# Patient Record
Sex: Female | Born: 2000 | Race: White | Hispanic: No | Marital: Single | Smoking: Never smoker
Health system: Southern US, Community
[De-identification: ages and names within clinical notes are randomized; demographics above are authoritative.]

## PROBLEM LIST (undated history)

## (undated) DIAGNOSIS — F32A Depression, unspecified: Secondary | ICD-10-CM

## (undated) DIAGNOSIS — L309 Dermatitis, unspecified: Secondary | ICD-10-CM

## (undated) DIAGNOSIS — F419 Anxiety disorder, unspecified: Secondary | ICD-10-CM

## (undated) HISTORY — DX: Dermatitis, unspecified: L30.9

## (undated) HISTORY — DX: Anxiety disorder, unspecified: F41.9

## (undated) HISTORY — DX: Depression, unspecified: F32.A

---
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
## 2018-03-10 DIAGNOSIS — Z0000 Encounter for general adult medical examination without abnormal findings: Secondary | ICD-10-CM | POA: Diagnosis not present

## 2018-03-10 DIAGNOSIS — J45990 Exercise induced bronchospasm: Secondary | ICD-10-CM | POA: Diagnosis not present

## 2018-03-10 DIAGNOSIS — Z13220 Encounter for screening for lipoid disorders: Secondary | ICD-10-CM | POA: Diagnosis not present

## 2018-03-10 DIAGNOSIS — Z23 Encounter for immunization: Secondary | ICD-10-CM | POA: Diagnosis not present

## 2018-07-31 DIAGNOSIS — H9312 Tinnitus, left ear: Secondary | ICD-10-CM | POA: Diagnosis not present

## 2018-08-09 DIAGNOSIS — M25571 Pain in right ankle and joints of right foot: Secondary | ICD-10-CM | POA: Diagnosis not present

## 2018-09-12 DIAGNOSIS — R21 Rash and other nonspecific skin eruption: Secondary | ICD-10-CM | POA: Diagnosis not present

## 2019-02-03 IMAGING — US US SOFT TISSUE HEAD/NECK
1 series · 12 of 12 positions shown · non-contrast
Comparison: None.

CLINICAL DATA: Palpable nodularity posterior to right ear.

EXAM:
ULTRASOUND OF HEAD/NECK SOFT TISSUES
TECHNIQUE: Ultrasound examination of the head and neck soft tissues was
performed in the area of clinical concern.

[Series 1: us soft tissue head/neck · 0.06mm/px · 12 of 12 slices shown]
[im 1/12]
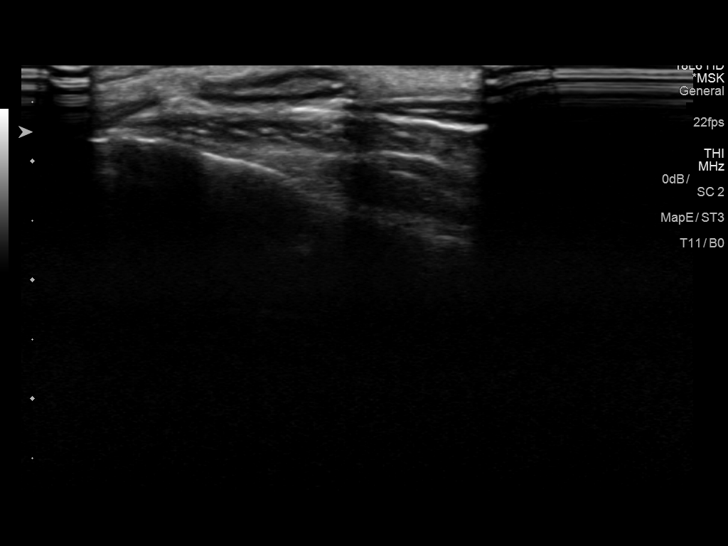
[im 2/12]
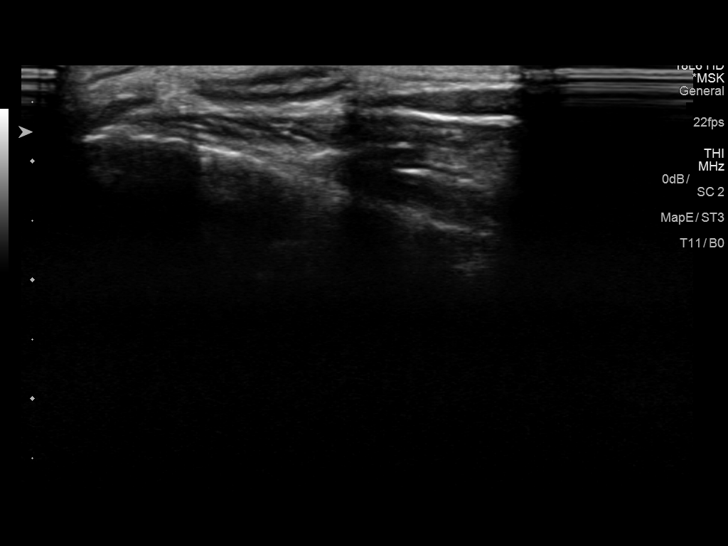
[im 3/12]
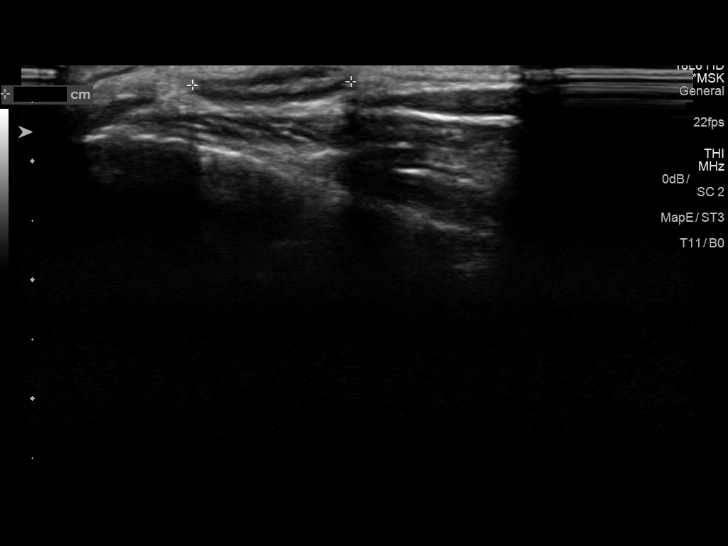
[im 4/12]
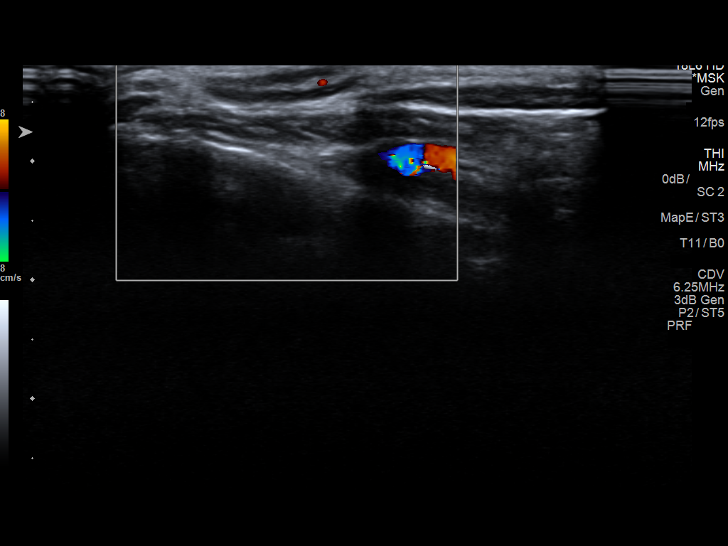
[im 5/12]
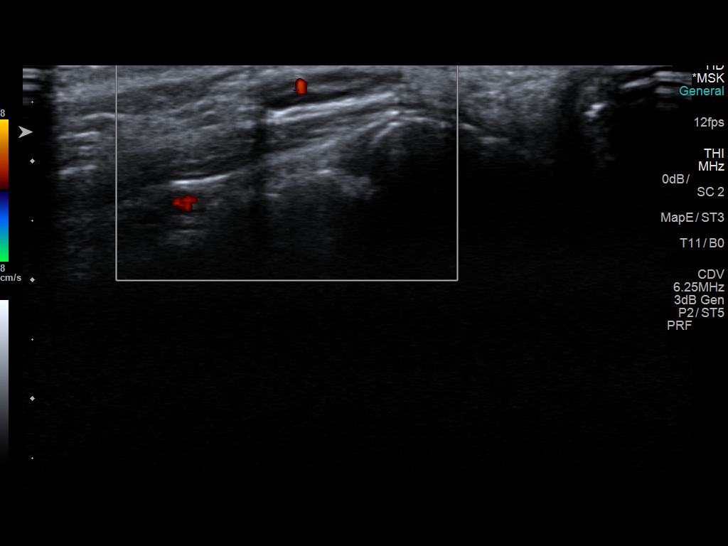
[im 6/12]
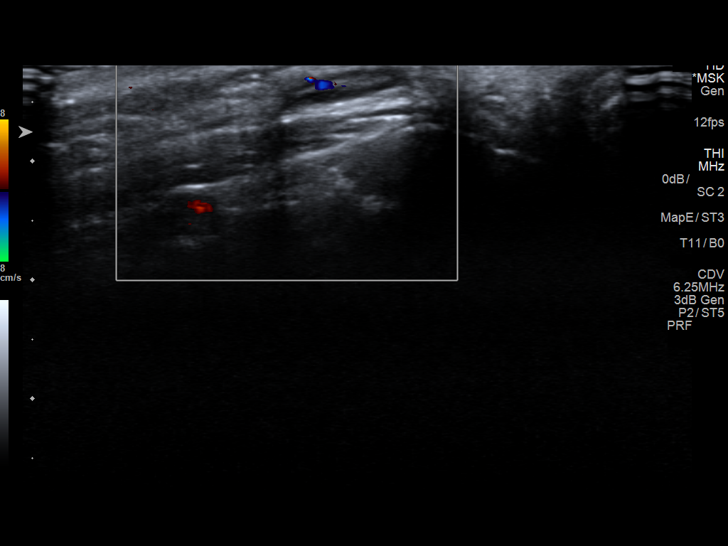
[im 7/12]
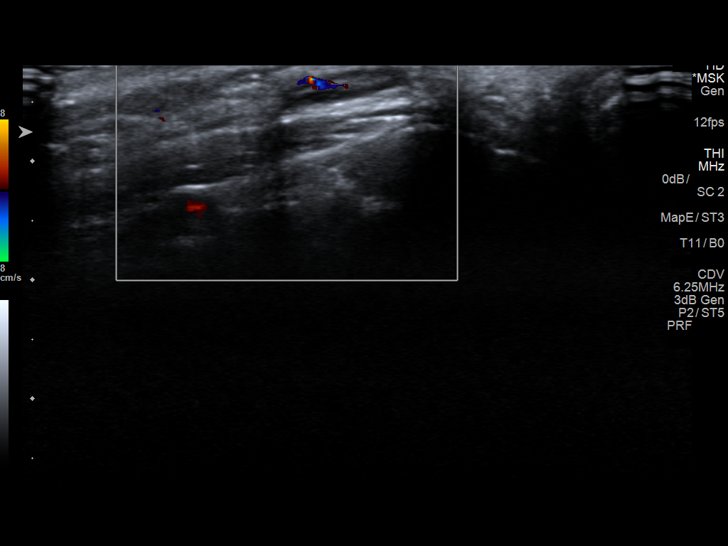
[im 8/12]
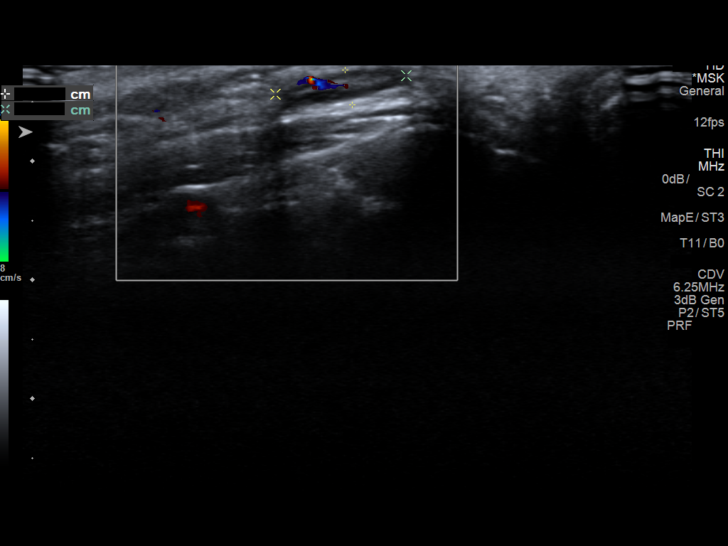
[im 9/12]
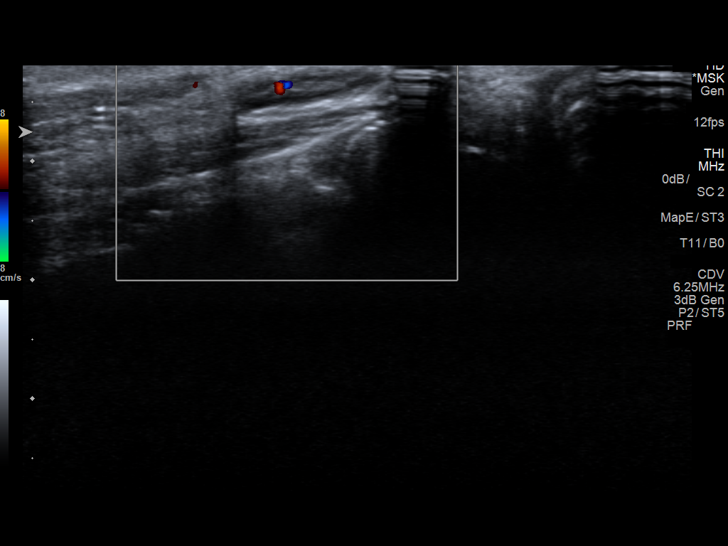
[im 10/12]
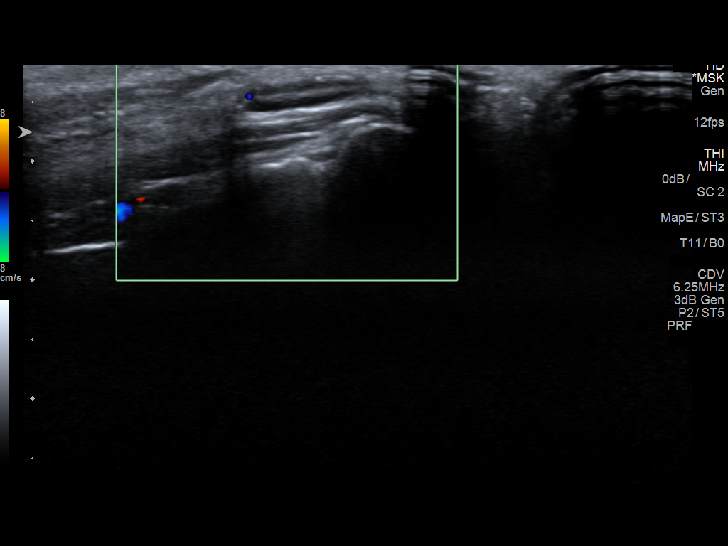
[im 11/12]
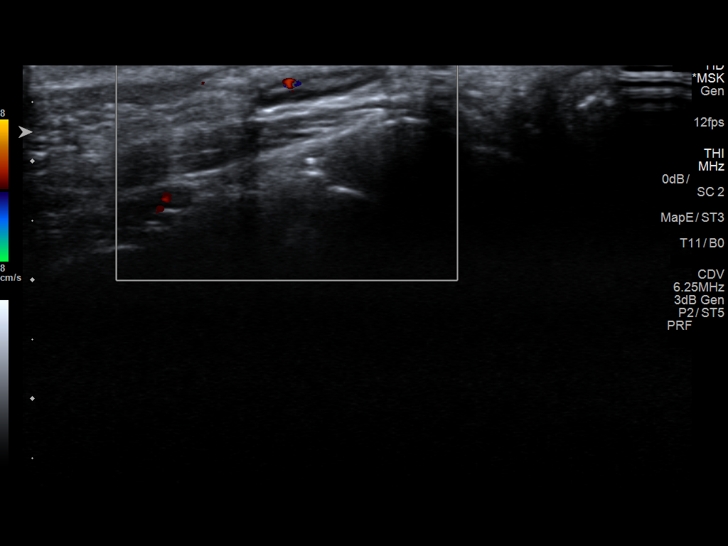
[im 12/12]
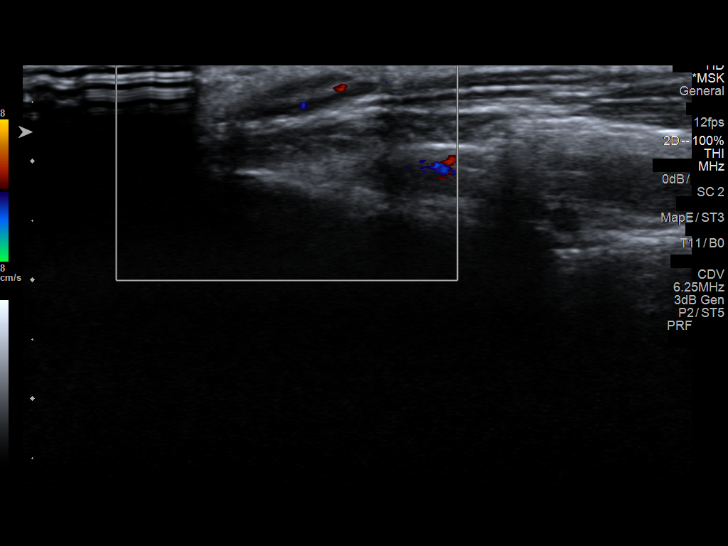

[12 of 12 positions shown; findings below may reference images not displayed]

FINDINGS: Ultrasound in the region of palpable abnormality shows a
well-circumscribed small ovoid structure having morphology
consistent with a small lymph node and measuring approximately 1.3 x
0.3 x 1.1 cm.
IMPRESSION: Palpable abnormality corresponds to a small soft tissue structure
likely representing a nonenlarged posterior auricular lymph node.

## 2021-08-13 ENCOUNTER — Emergency Department (HOSPITAL_BASED_OUTPATIENT_CLINIC_OR_DEPARTMENT_OTHER)
Admission: EM | Admit: 2021-08-13 | Discharge: 2021-08-13 | Disposition: A | Discharge status: Home / Self Care | Payer: Worker's Compensation | Attending: Emergency Medicine | Admitting: Emergency Medicine

## 2021-08-13 ENCOUNTER — Emergency Department (HOSPITAL_BASED_OUTPATIENT_CLINIC_OR_DEPARTMENT_OTHER): Payer: Worker's Compensation

## 2021-08-13 ENCOUNTER — Other Ambulatory Visit: Payer: Self-pay

## 2021-08-13 ENCOUNTER — Encounter (HOSPITAL_BASED_OUTPATIENT_CLINIC_OR_DEPARTMENT_OTHER): Payer: Self-pay

## 2021-08-13 DIAGNOSIS — S40011A Contusion of right shoulder, initial encounter: Secondary | ICD-10-CM

## 2021-08-13 DIAGNOSIS — S161XXA Strain of muscle, fascia and tendon at neck level, initial encounter: Secondary | ICD-10-CM

## 2021-08-13 DIAGNOSIS — M25551 Pain in right hip: Secondary | ICD-10-CM | POA: Diagnosis not present

## 2021-08-13 DIAGNOSIS — S39012A Strain of muscle, fascia and tendon of lower back, initial encounter: Secondary | ICD-10-CM | POA: Diagnosis not present

## 2021-08-13 DIAGNOSIS — Y990 Civilian activity done for income or pay: Secondary | ICD-10-CM | POA: Insufficient documentation

## 2021-08-13 DIAGNOSIS — W010XXA Fall on same level from slipping, tripping and stumbling without subsequent striking against object, initial encounter: Secondary | ICD-10-CM | POA: Diagnosis not present

## 2021-08-13 DIAGNOSIS — S199XXA Unspecified injury of neck, initial encounter: Secondary | ICD-10-CM | POA: Diagnosis present

## 2021-08-13 DIAGNOSIS — W19XXXA Unspecified fall, initial encounter: Secondary | ICD-10-CM

## 2021-08-13 LAB — PREGNANCY, URINE: Preg Test, Ur: NEGATIVE

## 2021-08-13 MED ORDER — ACETAMINOPHEN 500 MG PO TABS
1000.0000 mg | ORAL_TABLET | Freq: Once | ORAL | Status: AC
  Administered 2021-08-13: 22:00:00 1000 mg via ORAL
  Filled 2021-08-13: qty 2

## 2021-08-13 MED ORDER — IBUPROFEN 400 MG PO TABS
400.0000 mg | ORAL_TABLET | Freq: Once | ORAL | Status: AC
  Administered 2021-08-13: 22:00:00 400 mg via ORAL
  Filled 2021-08-13: qty 1

## 2021-08-13 MED ORDER — CYCLOBENZAPRINE HCL 10 MG PO TABS: 10.0000 mg | ORAL_TABLET | Freq: Two times a day (BID) | ORAL | 0 refills | Status: DC | PRN

## 2021-08-13 NOTE — ED Notes (Signed)
Patient discharged to home.  All discharge instructions reviewed.  Patient verbalized understanding via teachback method.  VS WDL.  Respirations even and unlabored.  Ambulatory out of ED.   °

## 2021-08-13 NOTE — ED Notes (Signed)
Specimen cup provided and urine specimen requested.  ?

## 2021-08-13 NOTE — ED Triage Notes (Signed)
Pt states she fell at work at 350pm-pain to right side of neck, right shoulder, upper/mid back and right hip-NAD-steady gait ?

## 2021-08-13 NOTE — ED Provider Notes (Signed)
?[facility] EMERGENCY DEPARTMENT ?Provider Note ? ? ?CSN: [id number] ?Arrival date & time: 08/13/21  1853 ? ?  ? ?History ? ?Chief Complaint  ?Patient presents with  ? Fall  ? ? ?Krista Dennis is a 21 y.o. female. ? ?Patient is a 21 year old female with no significant past medical history who is presenting today after a fall while she was at work.  She delivers packages for [employer] she was walking into her house when she tripped on some gumball's following back directly on her right side and hurting her neck.  She was able to get up and she was trying to continue to deliver packages but the pain started getting worse.  She denies hitting her head or loss of consciousness.  She has not taken anything for the pain and incident happened at 3:50 PM today.  She is now reporting pain that kind of wraps all the way around her neck, pain in her right shoulder and pain in her right side of her lower back and hip.  She was able to walk without difficulty and denies any pain in her knees or ankles.  Pain is achy and tight and worse when she moves. ? ?The history is provided by the patient.  ?Fall ? ? ?  ? ?Home Medications ?Prior to Admission medications   ?Medication Sig Start Date End Date Taking? Authorizing Provider  ?cyclobenzaprine (FLEXERIL) 10 MG tablet Take 1 tablet (10 mg total) by mouth 2 (two) times daily as needed for muscle spasms. 08/13/21  Yes Blanchie Dessert, MD  ?albuterol (PROVENTIL HFA;VENTOLIN HFA) 108 (90 Base) MCG/ACT inhaler Inhale 2 puffs into the lungs every 6 (six) hours as needed for wheezing or shortness of breath.    [provider]  ?bismuth subsalicylate (PEPTO BISMOL) 262 MG/15ML suspension Take 30 mLs by mouth every 6 (six) hours as needed for indigestion.    [provider]  ?ibuprofen (ADVIL,MOTRIN) 200 MG tablet Take 600 mg by mouth every 6 (six) hours as needed for moderate pain.    [provider]  ?ondansetron (ZOFRAN ODT) 8 MG disintegrating tablet  '8mg'$  ODT q4 hours prn nausea 07/04/16   Muthersbaugh, Jarrett Soho, PA-C  ?polyethylene glycol (MIRALAX / GLYCOLAX) packet Take 17 g by mouth daily as needed for moderate constipation.    [provider]  ?   ? ?Allergies    ?Cephalosporins   ? ?Review of Systems   ?Review of Systems ? ?Physical Exam ?Updated Vital Signs ?BP 123/83 (BP Location: Left Arm)   Pulse 85   Temp 98.3 ?F (36.8 ?C) (Oral)   Resp 20   Ht '5\' 9"'$  (1.753 m)   Wt 127 kg   LMP 08/04/2021   SpO2 99%   BMI 41.35 kg/m?  ?Physical Exam ?Vitals and nursing note reviewed.  ?Constitutional:   ?   General: She is not in acute distress. ?   Appearance: She is well-developed.  ?HENT:  ?   Head: Normocephalic and atraumatic.  ?Eyes:  ?   Pupils: Pupils are equal, round, and reactive to light.  ?Cardiovascular:  ?   Rate and Rhythm: Normal rate.  ?Pulmonary:  ?   Effort: Pulmonary effort is normal.  ?   Breath sounds: No rales.  ?Musculoskeletal:     ?   General: Tenderness present. Normal range of motion.  ?   Right shoulder: Tenderness and bony tenderness present. No deformity. Normal range of motion.  ?   Cervical back: Normal range of motion  and neck supple. Tenderness present. Spinous process tenderness and muscular tenderness present. Normal range of motion.  ?   Lumbar back: Tenderness and bony tenderness present. Normal range of motion.  ?     Back: ? ?   Comments: No edema  ?Skin: ?   General: Skin is warm and dry.  ?   Findings: No rash.  ?Neurological:  ?   Mental Status: She is alert and oriented to person, place, and time. Mental status is at baseline.  ?   Cranial Nerves: No cranial nerve deficit.  ?   Sensory: No sensory deficit.  ?   Motor: No weakness.  ?   Gait: Gait normal.  ?Psychiatric:     ?   Behavior: Behavior normal.  ? ? ?ED Results / Procedures / Treatments   ?Labs ?(all labs ordered are listed, but only abnormal results are displayed) ?Labs Reviewed  ?PREGNANCY, URINE  ? ? ?EKG ?None ? ?Radiology ?DG Lumbar Spine  Complete ? ?Result Date: 08/13/2021 ?CLINICAL DATA:  Lower back pain. EXAM: LUMBAR SPINE - COMPLETE 4+ VIEW COMPARISON:  None. FINDINGS: There is no evidence of lumbar spine fracture. Alignment is normal. Intervertebral disc spaces are maintained. IMPRESSION: Negative. Electronically Signed   By: Virgina Norfolk M.D.   On: 08/13/2021 23:08  ? ?DG Shoulder Right ? ?Result Date: 08/13/2021 ?CLINICAL DATA:  Status post fall. EXAM: RIGHT SHOULDER - 2+ VIEW COMPARISON:  None. FINDINGS: There is no evidence of fracture or dislocation. There is no evidence of arthropathy or other focal bone abnormality. Soft tissues are unremarkable. IMPRESSION: Negative. Electronically Signed   By: Virgina Norfolk M.D.   On: 08/13/2021 23:08  ? ?CT Cervical Spine Wo Contrast ? ?Result Date: 08/13/2021 ?CLINICAL DATA:  Status post fall. EXAM: CT CERVICAL SPINE WITHOUT CONTRAST TECHNIQUE: Multidetector CT imaging of the cervical spine was performed without intravenous contrast. Multiplanar CT image reconstructions were also generated. RADIATION DOSE REDUCTION: This exam was performed according to the departmental dose-optimization program which includes automated exposure control, adjustment of the mA and/or kV according to patient size and/or use of iterative reconstruction technique. COMPARISON:  None. FINDINGS: Alignment: Normal. Skull base and vertebrae: No acute fracture. No primary bone lesion or focal pathologic process. Soft tissues and spinal canal: No prevertebral fluid or swelling. No visible canal hematoma. Disc levels: Normal multilevel endplates are seen with normal multilevel intervertebral disc spaces. Normal, bilateral multilevel facet joints are noted. Upper chest: Negative. Other: None. IMPRESSION: No acute fracture or subluxation in the cervical spine. Electronically Signed   By: Virgina Norfolk M.D.   On: 08/13/2021 22:49   ? ?Procedures ?Procedures  ? ? ?Medications Ordered in ED ?Medications  ?ibuprofen (ADVIL) tablet  400 mg (400 mg Oral Given 08/13/21 2215)  ?acetaminophen (TYLENOL) tablet 1,000 mg (1,000 mg Oral Given 08/13/21 2216)  ? ? ?ED Course/ Medical Decision Making/ A&P ?  ?                        ?Medical Decision Making ?Amount and/or Complexity of Data Reviewed ?Labs: ordered. ?Radiology: ordered and independent interpretation performed. Decision-making details documented in ED Course. ? ?Risk ?OTC drugs. ?Prescription drug management. ? ? ?Patient presenting today for evaluation after a fall while she is at work.  No loss of consciousness or concern for head injury at this time.  She is neurovascularly intact here.  Patient was given pain control with Tylenol and ibuprofen.  Imaging is pending. ? ?  11:27 PM ?I independently visualized and interpreted patient's x-rays and her lumbar and shoulder x-ray showed no signs of acute fracture.  Cervical CT was negative for acute findings.  These findings were discussed with the patient.  She was given supportive care.  She was given return precautions and follow-up.  She has no questions at this time and is stable for discharge. ? ? ? ? ? ? ? ?Final Clinical Impression(s) / ED Diagnoses ?Final diagnoses:  ?Fall, initial encounter  ?Cervical strain, acute, initial encounter  ?Lumbar strain, initial encounter  ?Contusion of right shoulder, initial encounter  ? ? ?Rx / DC Orders ?ED Discharge Orders   ? ?      Ordered  ?  cyclobenzaprine (FLEXERIL) 10 MG tablet  2 times daily PRN       ? 08/13/21 2322  ? ?  ?  ? ?  ? ? ?  ?Blanchie Dessert, MD ?08/13/21 2328 ? ?

## 2021-08-13 NOTE — ED Notes (Signed)
Patient ambulatory from lobby to stretcher.  0 s/s acute distress.  Call bell in reach. ?

## 2021-12-01 ENCOUNTER — Encounter (INDEPENDENT_AMBULATORY_CARE_PROVIDER_SITE_OTHER): Payer: Self-pay | Admitting: Podiatry

## 2021-12-01 NOTE — Progress Notes (Signed)
NO SHOW. This encounter was created in error - please disregard.

## 2022-10-12 ENCOUNTER — Encounter: Payer: Self-pay | Admitting: Cardiology

## 2022-10-12 ENCOUNTER — Ambulatory Visit: Payer: Commercial Managed Care - HMO | Admitting: Cardiology

## 2022-10-12 ENCOUNTER — Other Ambulatory Visit: Payer: Commercial Managed Care - HMO

## 2022-10-12 VITALS — BP 132/84 | HR 81 | Resp 18 | Ht 69.0 in | Wt 307.4 lb

## 2022-10-12 DIAGNOSIS — Z8249 Family history of ischemic heart disease and other diseases of the circulatory system: Secondary | ICD-10-CM

## 2022-10-12 DIAGNOSIS — R002 Palpitations: Secondary | ICD-10-CM

## 2022-10-12 DIAGNOSIS — R0602 Shortness of breath: Secondary | ICD-10-CM

## 2022-10-12 DIAGNOSIS — R072 Precordial pain: Secondary | ICD-10-CM

## 2022-10-12 NOTE — Progress Notes (Signed)
ID:  Krista Dennis, DOB [birth year], MRN [id number]  PCP:  Jarrett Soho, PA-C  Cardiologist:  Tessa Lerner, DO, [facility] (established care 10/12/22)  REASON FOR CONSULT: Palpitations  REQUESTING PHYSICIAN:  Jarrett Soho, PA-C [address] [place],  [place] [address]  Chief Complaint  Patient presents with   Palpitations   New Patient (Initial Visit)    HPI  Krista Dennis is a 22 y.o. Caucasian female who presents to the clinic for evaluation of palpitations at the request of Jarrett Soho, [place]. Her past medical history and cardiovascular risk factors include: Obesity due to excess calories, anxiety, family history of heart disease.  Patient presents to the office with a chief complaint of " issues with a heart."  She has noticed episodes where "my heart pauses/sinks and then feels that it is restarting."  Symptoms are occurring every other day, lasting for few seconds, intermittent, no precipitating factors, usually self-limited.  No associated near-syncope or syncopal events.  Initially these episodes were being attributed to anxiety and depression; however, despite medication changes no significant improvement.  Therefore referred to cardiology for further evaluation.   Review of systems are also positive for chest pain and shortness of breath. Chest pain is located over the anterior chest wall/substernal at times, cramp-like sensation, intensity 4 out of 10, lasting for few seconds, ongoing for the last few months, worse with effort related activities, does not improve with rest it usually self-limited.  The discomfort is nonradiating.  And she has not taken any medications for this.  Shortness of breath is more pronounced with effort related activities that she has been attributing that to underlying obesity.  Family history of heart disease.  Paternal grandfather passed at the age of 53 due to myocardial infarction (patient is unsure of his other past medical  history/risk factors).  No premature CAD in the immediate first-degree relatives including parents and brothers.  FUNCTIONAL STATUS: No structured exercise program or daily routine.   ALLERGIES: Allergies  Allergen Reactions   Cephalosporins Hives    MEDICATION LIST PRIOR TO VISIT: Current Meds  Medication Sig   escitalopram (LEXAPRO) 20 MG tablet Take 20 mg by mouth daily.     PAST MEDICAL HISTORY: Past Medical History:  Diagnosis Date   Anxiety    Depression     PAST SURGICAL HISTORY: History reviewed. No pertinent surgical history.  FAMILY HISTORY: The patient family history includes Heart disease in her paternal grandfather.  SOCIAL HISTORY:  The patient  reports that she has never smoked. She has never used smokeless tobacco. She reports current alcohol use. She reports that she does not use drugs.  REVIEW OF SYSTEMS: Review of Systems  Cardiovascular:  Positive for chest pain (see HPI), dyspnea on exertion and palpitations. Negative for claudication, irregular heartbeat, leg swelling, near-syncope, orthopnea, paroxysmal nocturnal dyspnea and syncope.  Respiratory:  Negative for shortness of breath.   Hematologic/Lymphatic: Negative for bleeding problem.  Musculoskeletal:  Negative for muscle cramps and myalgias.  Neurological:  Negative for dizziness and light-headedness.    PHYSICAL EXAM:    10/12/2022   12:44 PM 08/13/2021    7:09 PM 08/13/2021    7:06 PM  Vitals with BMI  Height 5\' 9"  5\' 9"    Weight 307 lbs 6 oz 280 lbs   BMI 45.37 41.33   Systolic 132  123  Diastolic 84  83  Pulse 81  85    Physical Exam  Constitutional: No distress.  Age appropriate, hemodynamically stable.  Neck: No JVD present.  Cardiovascular: Normal rate, regular rhythm, S1 normal, S2 normal, intact distal pulses and normal pulses. Exam reveals no gallop, no S3 and no S4.  No murmur heard. Pulmonary/Chest: Effort normal and breath sounds normal. No stridor. She has no wheezes.  She has no rales.  Abdominal: Soft. Bowel sounds are normal. She exhibits no distension. There is no abdominal tenderness.  Musculoskeletal:        General: No edema.     Cervical back: Neck supple.  Neurological: She is alert and oriented to person, place, and time. She has intact cranial nerves (2-12).  Skin: Skin is warm and moist.   CARDIAC DATABASE: EKG: Oct 12, 2022: Sinus rhythm, 78 bpm, without underlying ischemia or injury pattern.  Echocardiogram: No results found for this or any previous visit from the past 1095 days.    Stress Testing: No results found for this or any previous visit from the past 1095 days.   Heart Catheterization: None  LABORATORY DATA:    Latest Ref Rng & Units 07/04/2016   12:25 AM  CBC  WBC 4.5 - 13.5 K/uL 4.1   Hemoglobin 12.0 - 16.0 g/dL 16.1   Hematocrit 09.6 - 49.0 % 42.0   Platelets 150 - 400 K/uL 243        Latest Ref Rng & Units 07/04/2016   12:25 AM  CMP  Glucose 65 - 99 mg/dL 86   BUN 6 - 20 mg/dL 8   Creatinine 0.45 - 4.09 mg/dL 8.11   Sodium 914 - 782 mmol/L 138   Potassium 3.5 - 5.1 mmol/L 3.4   Chloride 101 - 111 mmol/L 106   CO2 22 - 32 mmol/L 23   Calcium 8.9 - 10.3 mg/dL 9.2   Total Protein 6.5 - 8.1 g/dL 8.0   Total Bilirubin 0.3 - 1.2 mg/dL 0.7   Alkaline Phos 47 - 119 U/L 45   AST 15 - 41 U/L 21   ALT 14 - 54 U/L 17     Lipid Panel  No results found for: "CHOL", "TRIG", "HDL", "CHOLHDL", "VLDL", "LDLCALC", "LDLDIRECT", "LABVLDL"  No components found for: "NTPROBNP" No results for input(s): "PROBNP" in the last 8760 hours. No results for input(s): "TSH" in the last 8760 hours.  BMP No results for input(s): "NA", "K", "CL", "CO2", "GLUCOSE", "BUN", "CREATININE", "CALCIUM", "GFRNONAA", "GFRAA" in the last 8760 hours.  HEMOGLOBIN A1C No results found for: "HGBA1C", "MPG"  External Labs: Collected: September 15, 2022 provided by referring physician. BUN 13, creatinine 0.72. Sodium 138, potassium 4.5, chloride  104, bicarb 27 TSH 1.61. Hemoglobin 13, hematocrit 40.2%   IMPRESSION:    ICD-10-CM   1. Palpitations  R00.2 EKG 12-Lead    LONG TERM MONITOR (3-14 DAYS)    2. Precordial pain  R07.2 PCV ECHOCARDIOGRAM COMPLETE    PCV CARDIAC STRESS TEST    3. Shortness of breath  R06.02 PCV ECHOCARDIOGRAM COMPLETE    PCV CARDIAC STRESS TEST    4. Family history of heart disease  Z82.49     5. Class 3 severe obesity due to excess calories without serious comorbidity with body mass index (BMI) of 45.0 to 49.9 in adult [facility])  E66.01    Z68.42        RECOMMENDATIONS: NIKISHIA MCMORRIS is a 22 y.o. Caucasian female whose past medical history and cardiac risk factors include: Obesity due to excess calories, anxiety, family history of heart disease.  Palpitations No identifiable reversible cause. TSH and hemoglobin  within acceptable limits. EKG: Nonischemic and no ectopy. 7-day extended Holter monitor to evaluate for dysrhythmias.  Precordial pain Shortness of breath Precordial discomfort has both noncardiac/cardiac symptoms Shortness of breath predominantly with effort related activities may be secondary to underlying obesity/deconditioning Family history of heart disease as discussed above EKG is sinus rhythm without ischemia Echo will be ordered to evaluate for structural heart disease and left ventricular systolic function. GXT to evaluate for functional capacity and exercise-induced arrhythmia/ischemia  Class 3 severe obesity due to excess calories without serious comorbidity with body mass index (BMI) of 45.0 to 49.9 in adult [facility]) Body mass index is 45.4 kg/m. I reviewed with her importance of diet, regular physical activity/exercise, weight loss.   Patient is educated on the importance of increasing physical activity gradually as tolerated with a goal of moderate intensity exercise for 30 minutes a day 5 days a week.  Patient is educated on discussing with PCP screening for  hyperlipidemia/diabetes.  I have also asked her to keep a log of her blood pressures and to review with PCP to see if medical therapy for longitudinal follow-up is warranted.  Data Reviewed: I have independently reviewed external notes provided by the referring provider as part of this office visit.   I have independently reviewed results of labs, EKG as part of medical decision making. I have ordered the following tests:  Orders Placed This Encounter  Procedures   PCV CARDIAC STRESS TEST    Standing Status:   Future    Standing Expiration Date:   10/12/2023   LONG TERM MONITOR (3-14 DAYS)    Standing Status:   Future    Order Specific Question:   Where should this test be performed?    Answer:   [facility]    Order Specific Question:   Does the patient have an implanted cardiac device?    Answer:   No    Order Specific Question:   Prescribed days of wear    Answer:   7    Order Specific Question:   Type of enrollment    Answer:   Clinic Enrollment   EKG 12-Lead   PCV ECHOCARDIOGRAM COMPLETE    Standing Status:   Future    Standing Expiration Date:   10/12/2023   I have made no medications changes at today's encounter as noted above.  FINAL MEDICATION LIST END OF ENCOUNTER: No orders of the defined types were placed in this encounter.   There are no discontinued medications.   Current Outpatient Medications:    escitalopram (LEXAPRO) 20 MG tablet, Take 20 mg by mouth daily., Disp: , Rfl:   Orders Placed This Encounter  Procedures   PCV CARDIAC STRESS TEST   LONG TERM MONITOR (3-14 DAYS)   EKG 12-Lead   PCV ECHOCARDIOGRAM COMPLETE    There are no Patient Instructions on file for this visit.   --Continue cardiac medications as reconciled in final medication list. --Return in about 8 weeks (around 12/07/2022) for Reevaluation of, Palpitations, Chest pain, Review test results. or sooner if needed. --Continue follow-up with your primary care physician regarding the  management of your other chronic comorbid conditions.  Patient's questions and concerns were addressed to her satisfaction. She voices understanding of the instructions provided during this encounter.   This note was created using a voice recognition software as a result there may be grammatical errors inadvertently enclosed that do not reflect the nature of this encounter. Every attempt is made to correct such errors.  Tessa Lerner, [place], [facility]  Pager:  [phone] Office: [phone]

## 2022-10-28 ENCOUNTER — Ambulatory Visit: Payer: Commercial Managed Care - HMO

## 2022-10-28 DIAGNOSIS — R072 Precordial pain: Secondary | ICD-10-CM

## 2022-10-28 DIAGNOSIS — R0602 Shortness of breath: Secondary | ICD-10-CM

## 2022-11-11 ENCOUNTER — Other Ambulatory Visit: Payer: Commercial Managed Care - HMO

## 2022-11-29 ENCOUNTER — Ambulatory Visit: Payer: Commercial Managed Care - HMO

## 2022-11-29 DIAGNOSIS — R0602 Shortness of breath: Secondary | ICD-10-CM

## 2022-11-29 DIAGNOSIS — R072 Precordial pain: Secondary | ICD-10-CM

## 2022-12-07 ENCOUNTER — Encounter: Payer: Self-pay | Admitting: Cardiology

## 2022-12-07 ENCOUNTER — Ambulatory Visit: Payer: Commercial Managed Care - HMO | Admitting: Cardiology

## 2022-12-07 VITALS — BP 117/71 | HR 75 | Ht 69.0 in | Wt 302.0 lb

## 2022-12-07 DIAGNOSIS — R072 Precordial pain: Secondary | ICD-10-CM

## 2022-12-07 DIAGNOSIS — R0602 Shortness of breath: Secondary | ICD-10-CM

## 2022-12-07 DIAGNOSIS — R002 Palpitations: Secondary | ICD-10-CM

## 2022-12-07 NOTE — Progress Notes (Signed)
ID:  Krista Dennis, DOB Mar 21, 2001, MRN [id number]  PCP:  Jarrett Soho, PA-C  Cardiologist:  Tessa Lerner, DO, [facility] (established care 10/12/22)  Date: 12/07/22 Last Office Visit: 10/12/2022  Chief Complaint  Patient presents with   Follow-up    Reevaluation of palpitations, chest pain, shortness of breath.    HPI  Krista Dennis is a 22 y.o. Caucasian female who presents to the clinic for evaluation of palpitations at the request of Jarrett Soho, [place]. Her past medical history and cardiovascular risk factors include: Obesity due to excess calories, anxiety, family history of heart disease.  Patient presents to the office with a chief complaint of " issues with a heart."  She has noticed episodes where "my heart pauses/sinks and then feels that it is restarting."  Symptoms are occurring every other day, lasting for few seconds, intermittent, no precipitating factors, usually self-limited.  No associated near-syncope or syncopal events.  Initially these episodes were being attributed to anxiety and depression; however, despite medication changes no significant improvement.  Therefore referred to cardiology for further evaluation.  Since last office visit Zio patch demonstrates underlying rhythm to be sinus, tachycardia burden of approximately 15%, average heart rate of 88 bpm, no significant arrhythmias.  Of note while asleep she had a secondary type I AV block likely secondary to apnea.  Patient triggered events either illustrated sinus rhythm or sinus tachycardia with rare PACs or PVCs.  In addition, the last office visit she complained of precordial discomfort and shortness of breath.  Precordial discomfort had both cardiac and noncardiac symptoms and given her family history shared decision was to proceed with echo and GXT.  Echo notes preserved LVEF without any significant valvular heart disease.  GXT was submaximal as she only achieved 83% of APMHR but at the current workload  stress ECG was negative for ischemia.  Since last office visit palpitations are better, chest pain is fine, and shortness of breath is only occasional.  Family history of heart disease.  Paternal grandfather passed at the age of 19 due to myocardial infarction (patient is unsure of his other past medical history/risk factors).  No premature CAD in the immediate first-degree relatives including parents and brothers.  FUNCTIONAL STATUS: No structured exercise program or daily routine.   ALLERGIES: Allergies  Allergen Reactions   Cephalosporins Hives    MEDICATION LIST PRIOR TO VISIT: Current Meds  Medication Sig   escitalopram (LEXAPRO) 20 MG tablet Take 20 mg by mouth daily.     PAST MEDICAL HISTORY: Past Medical History:  Diagnosis Date   Anxiety    Depression     PAST SURGICAL HISTORY: History reviewed. No pertinent surgical history.  FAMILY HISTORY: The patient family history includes Heart disease in her paternal grandfather.  SOCIAL HISTORY:  The patient  reports that she has never smoked. She has never used smokeless tobacco. She reports current alcohol use. She reports that she does not use drugs.  REVIEW OF SYSTEMS: Review of Systems  Cardiovascular:  Negative for chest pain, claudication, dyspnea on exertion, irregular heartbeat, leg swelling, near-syncope, orthopnea, palpitations, paroxysmal nocturnal dyspnea and syncope.  Respiratory:  Negative for shortness of breath.   Hematologic/Lymphatic: Negative for bleeding problem.  Musculoskeletal:  Negative for muscle cramps and myalgias.  Neurological:  Negative for dizziness and light-headedness.    PHYSICAL EXAM:    12/07/2022   10:36 AM 10/12/2022   12:44 PM 08/13/2021    7:09 PM  Vitals with BMI  Height 5\' 9"   5\' 9"  5\' 9"   Weight 302 lbs 307 lbs 6 oz 280 lbs  BMI 44.58 45.37 41.33  Systolic 117 132   Diastolic 71 84   Pulse 75 81     Physical Exam  Constitutional: No distress.  Age appropriate,  hemodynamically stable.   Neck: No JVD present.  Cardiovascular: Normal rate, regular rhythm, S1 normal, S2 normal, intact distal pulses and normal pulses. Exam reveals no gallop, no S3 and no S4.  No murmur heard. Pulmonary/Chest: Effort normal and breath sounds normal. No stridor. She has no wheezes. She has no rales.  Abdominal: Soft. Bowel sounds are normal. She exhibits no distension. There is no abdominal tenderness.  Musculoskeletal:        General: No edema.     Cervical back: Neck supple.  Neurological: She is alert and oriented to person, place, and time. She has intact cranial nerves (2-12).  Skin: Skin is warm and moist.   CARDIAC DATABASE: EKG: Oct 12, 2022: Sinus rhythm, 78 bpm, without underlying ischemia or injury pattern.  Echocardiogram: 11/29/2022: Normal LV systolic function with visual EF 60-65%.  Left ventricle cavity is normal in size. Mild concentric hypertrophy of the left ventricle. Normal global wall motion. Normal diastolic filling pattern, normal LAP. No significant valvular heart disease. No prior study for comparison.     Stress Testing: Exercise treadmill stress test 11/01/2022: Exercise treadmill stress test performed using Bruce protocol. Patient exercised for a total of 6 minutes and 48 seconds, achieving 8.2 METS, and 83% of age predicted maximum heart rate. Exercise capacity was low normal. No chest pain reported. Near normal heart rate and hemodynamic response. Stress EKG at near optimal 83% MPHR revealed no ischemic changes. Low risk study.    Cardiac monitor (Zio Patch): Oct 12, 2022 -Oct 19, 2022 Dominant rhythm sinus followed by tachycardia (15% burden). Heart rate 36-161 bpm. Avg HR 88 bpm. No atrial fibrillation, supraventricular tachycardia, ventricular tachycardia, high grade AV block, pauses (3 seconds or longer). Total ventricular ectopic burden <1%. Total supraventricular ectopic burden <1%. Minimum heart rate: 10/13/2022 at 6:12 AM,  sinus rhythm with second-degree type I AV block. Patient triggered events: Approximately 39 events. Underlying rhythm either sinus or sinus tachycardia with rare PACs/PVCs.   LABORATORY DATA:    Latest Ref Rng & Units 07/04/2016   12:25 AM  CBC  WBC 4.5 - 13.5 K/uL 4.1   Hemoglobin 12.0 - 16.0 g/dL 16.1   Hematocrit 09.6 - 49.0 % 42.0   Platelets 150 - 400 K/uL 243        Latest Ref Rng & Units 07/04/2016   12:25 AM  CMP  Glucose 65 - 99 mg/dL 86   BUN 6 - 20 mg/dL 8   Creatinine 0.45 - 4.09 mg/dL 8.11   Sodium 914 - 782 mmol/L 138   Potassium 3.5 - 5.1 mmol/L 3.4   Chloride 101 - 111 mmol/L 106   CO2 22 - 32 mmol/L 23   Calcium 8.9 - 10.3 mg/dL 9.2   Total Protein 6.5 - 8.1 g/dL 8.0   Total Bilirubin 0.3 - 1.2 mg/dL 0.7   Alkaline Phos 47 - 119 U/L 45   AST 15 - 41 U/L 21   ALT 14 - 54 U/L 17     Lipid Panel  No results found for: "CHOL", "TRIG", "HDL", "CHOLHDL", "VLDL", "LDLCALC", "LDLDIRECT", "LABVLDL"  No components found for: "NTPROBNP" No results for input(s): "PROBNP" in the last 8760 hours. No results for input(s): "TSH" in  the last 8760 hours.  BMP No results for input(s): "NA", "K", "CL", "CO2", "GLUCOSE", "BUN", "CREATININE", "CALCIUM", "GFRNONAA", "GFRAA" in the last 8760 hours.  HEMOGLOBIN A1C No results found for: "HGBA1C", "MPG"  External Labs: Collected: September 15, 2022 provided by referring physician. BUN 13, creatinine 0.72. Sodium 138, potassium 4.5, chloride 104, bicarb 27 TSH 1.61. Hemoglobin 13, hematocrit 40.2%   IMPRESSION:    ICD-10-CM   1. Palpitations  R00.2     2. Precordial pain  R07.2     3. Shortness of breath  R06.02     4. Class 3 severe obesity due to excess calories without serious comorbidity with body mass index (BMI) of 40.0 to 44.9 in adult [facility])  E66.01    Z68.41        RECOMMENDATIONS: DALINDA OGOREK is a 22 y.o. Caucasian female whose past medical history and cardiac risk factors include: Obesity due to  excess calories, anxiety, family history of heart disease.  Palpitations No identifiable reversible cause. TSH and hemoglobin within acceptable limits. EKG: Nonischemic and no ectopy. Zio patch: Average heart rate 80 bpm, underlying rhythm sinus/sinus tachycardia, tachycardia burden approximately 15%, 39 patient triggered events illustrated underlying rhythm to be sinus or sinus tachycardia with rare PACs/PVCs. Symptoms are well-controlled for now and therefore we will hold off on pharmacological therapy.    Precordial pain Shortness of breath Both are significantly improved or resolved since last office visit. Echo: Preserved LVEF, no significant valvular heart disease, see report for additional details. GXT: Submaximal, achieved 83% of age-predicted maximal heart rate, at the current workload ECG negative for ischemia, low risk study. At her age obstructive CAD is less likely. Given her family history of heart disease recommend improving her modifiable cardiovascular risk factors which include but not limited to, blood pressure management, lipid management, ischemic control, weight loss.  Class 3 severe obesity due to excess calories without serious comorbidity with body mass index (BMI) of 44.0 to 44.9 in adult [facility]) Body mass index is 44.6 kg/m. I reviewed with her importance of diet, regular physical activity/exercise, weight loss.   Patient is educated on the importance of increasing physical activity gradually as tolerated with a goal of moderate intensity exercise for 30 minutes a day 5 days a week.  Patient is educated on discussing with PCP screening for hyperlipidemia/diabetes.    In addition, she was noted to have 1 episode of second-degree type I AV block on the recent Zio patch.  When discussing these findings with the patient she informs me that she was sleeping.  No additional cardiovascular testing is warranted.  However it raises suspicion for possible sleep apnea.  I have  asked her to discuss this further with PCP and consider sleep medicine consult for further evaluation.  FINAL MEDICATION LIST END OF ENCOUNTER: No orders of the defined types were placed in this encounter.   There are no discontinued medications.   Current Outpatient Medications:    escitalopram (LEXAPRO) 20 MG tablet, Take 20 mg by mouth daily., Disp: , Rfl:   No orders of the defined types were placed in this encounter.   There are no Patient Instructions on file for this visit.   --Continue cardiac medications as reconciled in final medication list. --Return if symptoms worsen or fail to improve. or sooner if needed. --Continue follow-up with your primary care physician regarding the management of your other chronic comorbid conditions.  Patient's questions and concerns were addressed to her satisfaction. She voices understanding of  the instructions provided during this encounter.   This note was created using a voice recognition software as a result there may be grammatical errors inadvertently enclosed that do not reflect the nature of this encounter. Every attempt is made to correct such errors.  Tessa Lerner, [place], [facility]  Pager:  [phone] Office: [phone]

## 2023-08-04 HISTORY — PX: OSTEOTOMY: SHX137

## 2023-09-16 ENCOUNTER — Encounter: Payer: Self-pay | Admitting: Allergy

## 2023-09-16 ENCOUNTER — Other Ambulatory Visit: Payer: Self-pay

## 2023-09-16 ENCOUNTER — Ambulatory Visit: Admitting: Allergy

## 2023-09-16 VITALS — BP 112/64 | HR 79 | Temp 97.7°F | Resp 16 | Ht 69.0 in | Wt 299.4 lb

## 2023-09-16 DIAGNOSIS — T50905D Adverse effect of unspecified drugs, medicaments and biological substances, subsequent encounter: Secondary | ICD-10-CM

## 2023-09-16 DIAGNOSIS — K9049 Malabsorption due to intolerance, not elsewhere classified: Secondary | ICD-10-CM

## 2023-09-16 DIAGNOSIS — J310 Chronic rhinitis: Secondary | ICD-10-CM

## 2023-09-16 DIAGNOSIS — L2089 Other atopic dermatitis: Secondary | ICD-10-CM | POA: Diagnosis not present

## 2023-09-16 DIAGNOSIS — H109 Unspecified conjunctivitis: Secondary | ICD-10-CM

## 2023-09-16 DIAGNOSIS — H1013 Acute atopic conjunctivitis, bilateral: Secondary | ICD-10-CM

## 2023-09-16 DIAGNOSIS — Z888 Allergy status to other drugs, medicaments and biological substances status: Secondary | ICD-10-CM

## 2023-09-16 NOTE — Patient Instructions (Addendum)
 Environmental Allergies Suspected environmental allergens contributing to sinus symptoms. Explained allergy skin testing for pollens, molds, and indoor allergens. - Schedule environmental allergy testing.  - Advise to avoid antihistamines for three days prior to testing. - Can use for nasal congestion nasal steroid sprays like Nasacort, Rhinocort or Flonase.  All used 2 sprays each nostril daily for 1-2 weeks at a time before stopping once nasal congestion improves for maximum benefit. - For nasal drainage can use Astepro 2 sprays each nostril twice a day as needed for runny nose - For general allergy symptoms can use antihistamine like Allegra, Xyzal or Zyrtec (store brands are fine)  - For itchy/watery eyes can use Pataday 1 drop each eye daily as needed  Food Intolerance vs allergy Explained difference between food allergies and intolerances. Recommended targeted food testing for lactose and gluten due to reported symptoms. - Provide food testing sheet to identify foods for targeted testing. - Schedule food allergy testing for lactose and gluten.  Eczema Discussed medicated ointments for flare-ups. - Prescribe medicated ointment for eczema flare-ups.  Cephalosporin Allergy vs Neosporin? Explained cephalosporin allergy can be evaluated through graded challenge. Suggested avoidance of Neosporin due to allergenic properties. - Recommend a graded challenge for cephalosporin allergy to rule this out as an true allergy. - Advise to avoid Neosporin.  If needed we can do patch testing to confirm this.    Schedule for skin testing and hold antihistamine for 3 days prior.

## 2023-09-16 NOTE — Progress Notes (Signed)
 New Patient Note  RE: Krista Dennis MRN: [id number] DOB: 23-Apr-2001 Date of Office Visit: 09/16/2023   Primary care provider: Jarrett Soho, PA-C  Chief Complaint: allergies  History of present illness: Krista Dennis is a 23 y.o. female presenting today for evaluation of allergies.  Discussed the use of AI scribe software for clinical note transcription with the patient, who gave verbal consent to proceed.  She is interested in allergy testing due to her sister's recent diagnosis of multiple allergies, which has prompted her curiosity about her own potential allergens. She experiences symptoms such as nasal congestion, watery eyes, and pruritus of the eyes. She denies sneezing or rhinorrhea. She occasionally takes the [employer] brand of cetirizine for her symptoms but does not use it daily and has not used it this season. She has not used nasal sprays or ophthalmic drops.  She has had two episodes of sinusitis, one in October 2023 and another in spring 2024, both treated with antibiotics.   She suspects lactose intolerance as dairy products cause gastrointestinal discomfort and is curious about a potential gluten intolerance. Various foods tend to upset her stomach. .  She has a history of eczema, primarily affecting the axillary regions, with occasional xerotic patches on her wrists and knees. She manages these with emollients but finds the relief temporary.  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  She was told as a infant that she was allergic to cephalosporins after developing a rash, but there is uncertainty about this diagnosis. She tolerates penicillins well. She also recalls a reaction to Neosporin, leading her to use triple antibiotic ointment instead. She states the issue with cephalosporins could have been a mix-up at the time with neosporin.   Cephalosporin is listed in her allergy list.    Review of systems: 10pt ROS negative unless noted above in HPI  Past medical history: Past Medical History:  Diagnosis Date   Anxiety    Depression    Eczema     Past surgical history: Past Surgical History:  Procedure Laterality Date   OSTEOTOMY  08/04/2023   WISDOM TOOTH EXTRACTION  2017    Family history:  Family History  Problem Relation Age of Onset   Angioedema Mother    Allergic rhinitis Mother    Asthma Sister    Eczema Sister    Allergic rhinitis Sister    Heart disease Paternal Grandfather    Urticaria Neg Hx     Social history: Lives in a home with carpeting in the bedroom with gas heating with central and window cooling.  Dogs and cats in the home.  No concern for water damage, mildew or roaches in the home.  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  Denies a smoking history.   Medication List: Current Outpatient Medications  Medication Sig Dispense Refill   escitalopram (LEXAPRO) 20 MG tablet Take 20 mg by mouth daily.     No current facility-administered medications for this visit.    Known medication allergies: Allergies  Allergen Reactions   Cephalosporins Hives     Physical examination: Blood pressure 112/64, pulse 79, temperature 97.7 F (36.5 C), temperature source Temporal, resp. rate 16, height 5\' 9"  (1.753 m), weight 299 lb 6.4 oz (135.8 kg), SpO2 96%.  General: Alert, interactive, in no acute distress. HEENT: PERRLA, TMs pearly gray,  turbinates non-edematous without discharge, post-pharynx non erythematous. Neck: Supple without lymphadenopathy. Lungs: Clear to auscultation without wheezing, rhonchi or rales. {no increased work of breathing. CV: Normal S1, S2 without murmurs. Abdomen: Nondistended, nontender. Skin: Warm and dry, without lesions or rashes. Extremities:  No clubbing, cyanosis or edema. Neuro:   Grossly intact.  Diagnositics/Labs: None  today  Assessment and plan: Rhinoconjunctivitis Suspected environmental allergens contributing to sinus symptoms. Explained allergy skin testing for pollens, molds, and indoor allergens. - Schedule environmental allergy testing.  - Advise to avoid antihistamines for three days prior to testing. - Can use for nasal congestion nasal steroid sprays like Nasacort, Rhinocort or Flonase.  All used 2 sprays each nostril daily for 1-2 weeks at a time before stopping once nasal congestion improves for maximum benefit. - For nasal drainage can use Astepro 2 sprays each nostril twice a day as needed for runny nose - For general allergy symptoms can use antihistamine like Allegra, Xyzal or Zyrtec (store brands are fine)  - For itchy/watery eyes can use Pataday 1 drop each eye daily as needed  Food Intolerance vs allergy Explained difference between food allergies and intolerances. Recommended targeted food testing for lactose and gluten due to reported symptoms. - Provide food testing sheet to identify foods for targeted testing. - Schedule food allergy testing for lactose and gluten.  Eczema Discussed medicated ointments for flare-ups. - Prescribe medicated ointment for eczema flare-ups.  Cephalosporin Allergy vs Neosporin? Explained cephalosporin allergy can be evaluated through graded challenge. Suggested avoidance of Neosporin due to allergenic properties. - Recommend a graded challenge for cephalosporin allergy to rule this out as an true allergy. - Advise to avoid Neosporin.  If needed we can do patch testing to confirm this.    Schedule for skin testing and hold antihistamine for 3 days prior.    I appreciate the opportunity to take part in Krista Dennis's care. Please do not hesitate to contact me with questions.  Sincerely,   Margo Aye, MD Allergy/Immunology [facility]

## 2023-09-21 ENCOUNTER — Encounter: Payer: Self-pay | Admitting: Allergy

## 2023-09-21 ENCOUNTER — Ambulatory Visit (INDEPENDENT_AMBULATORY_CARE_PROVIDER_SITE_OTHER): Admitting: Allergy

## 2023-09-21 DIAGNOSIS — H1013 Acute atopic conjunctivitis, bilateral: Secondary | ICD-10-CM | POA: Diagnosis not present

## 2023-09-21 DIAGNOSIS — J3089 Other allergic rhinitis: Secondary | ICD-10-CM | POA: Diagnosis not present

## 2023-09-21 DIAGNOSIS — J302 Other seasonal allergic rhinitis: Secondary | ICD-10-CM | POA: Diagnosis not present

## 2023-09-21 DIAGNOSIS — K9049 Malabsorption due to intolerance, not elsewhere classified: Secondary | ICD-10-CM | POA: Diagnosis not present

## 2023-09-21 NOTE — Progress Notes (Signed)
 Follow-up Note  RE: Krista Dennis MRN: [id number] DOB: [birth year] Date of Office Visit: 09/21/2023   History of present illness: Krista Dennis is a 23 y.o. female presenting today for skin testing visit.  She was last seen in the office on 09/16/23 by myself. She is in her usual state of health today and has held antihistamines for at least 3 days for testing.   Medication List: Current Outpatient Medications  Medication Sig Dispense Refill   escitalopram (LEXAPRO) 20 MG tablet Take 20 mg by mouth daily.     No current facility-administered medications for this visit.     Known medication allergies: Allergies  Allergen Reactions   Cephalosporins Hives   Diagnostics/Labs:  Allergy testing:   Airborne Adult Perc - 09/21/23 0944     Time Antigen Placed 4742    Allergen Manufacturer Waynette Buttery    Location Back    Number of Test 55    Panel 1 Select    1. Control-Buffer 50% Glycerol Negative    2. Control-Histamine 2+    3. Bahia 3+    4. [place] 2+    5. Johnson 3+    6. [place] Blue 4+    7. Meadow Fescue 4+    8. Perennial Rye 4+    9. Timothy 4+    10. Ragweed Mix 2+    11. Cocklebur Negative    12. Plantain,  English 2+    13. Baccharis 2+    14. Dog Fennel Negative    15. Gu[place] Thistle 2+    16. Lamb's Quarters 2+    17. Sheep Sorrell Negative    18. Rough Pigweed Negative    19. Marsh Elder, Rough Negative    20. Mugwort, Common Negative    21. Box, Elder 4+    22. Cedar, red Negative    23. Sweet Gum 3+    24. Pecan Pollen 3+    25. Pine Mix Negative    26. Walnut, Black Pollen 3+    27. Red Mulberry Negative    28. Ash Mix 2+    29. Birch Mix 4+    30. Beech American 4+    31. Cottonwood, [place] 2+    32. Hickory, White 4+    33. Maple Mix 2+    34. Oak, [place] Mix 3+    35. Sycamore Eastern Negative    36. Alternaria Alternata 2+    37. Cladosporium Herbarum Negative    38. Aspergillus Mix Negative    39. Penicillium Mix Negative     40. Bipolaris Sorokiniana (Helminthosporium) Negative    41. Drechslera Spicifera (Curvularia) Negative    42. Mucor Plumbeus Negative    44. Aureobasidium Pullulans (pullulara) Negative    45. Rhizopus Oryzae Negative    46. Botrytis Cinera Negative    47. Epicoccum Nigrum Negative    48. Phoma Betae Negative    49. Dust Mite Mix 4+    50. Cat Hair 10,000 BAU/ml 2+    51.  Dog Epithelia Negative    52. Mixed Feathers Negative    53. Horse Epithelia Negative    54. Cockroach, German 2+    55. Tobacco Leaf Negative             Food Adult Perc - 09/21/23 0900     Time Antigen Placed 0945    Allergen Manufacturer Waynette Buttery    Location Back    Number of allergen test 12    3.  Wheat Negative    5. Milk, Cow Negative    28. Oat  --   +/-   29. Rice Negative    30. Barley Negative    31. Rye  Negative    32. Hops Negative    46. Mushrooms Negative    48. Avocado Negative    55. Orange  Negative    65. Pineapple Negative    69. Ginger Negative             Allergy testing results were read and interpreted by provider, documented by clinical staff.   Assessment and plan: Allergic rhinitis with conjunctivitis - Testing today showed: grasses, ragweed, weeds, trees, outdoor molds, dust mites, cat, and cockroach - Copy of test results provided.  - Avoidance measures provided. - Can use for nasal congestion nasal steroid sprays like Nasacort, Rhinocort or Flonase.  All used 2 sprays each nostril daily for 1-2 weeks at a time before stopping once nasal congestion improves for maximum benefit. - For nasal drainage can use Astepro 2 sprays each nostril twice a day as needed for runny nose - For general allergy symptoms can use antihistamine like Allegra, Xyzal or Zyrtec (store brands are fine)  - For itchy/watery eyes can use Pataday 1 drop each eye daily as needed - Consider allergy shots as a means of long-term control. - Allergy shots "re-train" and "reset" the immune  system to ignore environmental allergens and decrease the resulting immune response to those allergens (sneezing, itchy watery eyes, runny nose, nasal congestion, etc).    - Allergy shots improve symptoms in 75-85% of patients.  - We can discuss more at a future appointment if the medications are not working for you.  Food Intolerance Explained difference between food allergies and intolerances. Recommended targeted food testing for lactose and gluten due to reported symptoms. - Skin testing for food allergy is equivocal for oat.  If you have symptoms after eating oat containing foods then would avoid oat in the diet.  If you tolerate eating oat foods then keep in diet.   Eczema - moisturize after bathing - can use Triamcinolone ointment twice a day as needed for eczema flares on body  Cephalosporin Allergy vs Neosporin? Explained cephalosporin allergy can be evaluated through graded challenge. Suggested avoidance of Neosporin due to allergenic properties. - Recommend a graded challenge for cephalosporin allergy to rule this out as an true allergy. - Advise to avoid Neosporin.  If needed we can do patch testing to confirm this.    Follow-up in 4-6 months or sooner if needed   I appreciate the opportunity to take part in Kayliana's care. Please do not hesitate to contact me with questions.  Sincerely,   Catha Clink, MD Allergy/Immunology [facility]

## 2023-09-21 NOTE — Patient Instructions (Addendum)
 Environmental Allergies - Testing today showed: grasses, ragweed, weeds, trees, outdoor molds, dust mites, cat, and cockroach - Copy of test results provided.  - Avoidance measures provided. - Can use for nasal congestion nasal steroid sprays like Nasacort, Rhinocort or Flonase.  All used 2 sprays each nostril daily for 1-2 weeks at a time before stopping once nasal congestion improves for maximum benefit. - For nasal drainage can use Astepro 2 sprays each nostril twice a day as needed for runny nose - For general allergy symptoms can use antihistamine like Allegra, Xyzal or Zyrtec (store brands are fine)  - For itchy/watery eyes can use Pataday 1 drop each eye daily as needed - Consider allergy shots as a means of long-term control. - Allergy shots "re-train" and "reset" the immune system to ignore environmental allergens and decrease the resulting immune response to those allergens (sneezing, itchy watery eyes, runny nose, nasal congestion, etc).    - Allergy shots improve symptoms in 75-85% of patients.  - We can discuss more at a future appointment if the medications are not working for you.  Food Intolerance Explained difference between food allergies and intolerances. Recommended targeted food testing for lactose and gluten due to reported symptoms. - Skin testing for food allergy is equivocal for oat.  If you have symptoms after eating oat containing foods then would avoid oat in the diet.  If you tolerate eating oat foods then keep in diet.   Eczema - moisturize after bathing - can use Triamcinolone ointment twice a day as needed for eczema flares on body  Cephalosporin Allergy vs Neosporin? Explained cephalosporin allergy can be evaluated through graded challenge. Suggested avoidance of Neosporin due to allergenic properties. - Recommend a graded challenge for cephalosporin allergy to rule this out as an true allergy. - Advise to avoid Neosporin.  If needed we can do patch testing to  confirm this.    Follow-up in 4-6 months or sooner if needed

## 2023-11-12 ENCOUNTER — Emergency Department (HOSPITAL_BASED_OUTPATIENT_CLINIC_OR_DEPARTMENT_OTHER)
Admission: EM | Admit: 2023-11-12 | Discharge: 2023-11-12 | Disposition: A | Discharge status: Home / Self Care | Attending: Emergency Medicine | Admitting: Emergency Medicine

## 2023-11-12 ENCOUNTER — Emergency Department (HOSPITAL_BASED_OUTPATIENT_CLINIC_OR_DEPARTMENT_OTHER)

## 2023-11-12 ENCOUNTER — Other Ambulatory Visit: Payer: Self-pay

## 2023-11-12 DIAGNOSIS — R09A2 Foreign body sensation, throat: Secondary | ICD-10-CM | POA: Diagnosis not present

## 2023-11-12 DIAGNOSIS — R220 Localized swelling, mass and lump, head: Secondary | ICD-10-CM | POA: Diagnosis present

## 2023-11-12 LAB — CBC WITH DIFFERENTIAL/PLATELET
Abs Immature Granulocytes: 0.01 10*3/uL (ref 0.00–0.07)
Basophils Absolute: 0 10*3/uL (ref 0.0–0.1)
Basophils Relative: 0 %
Eosinophils Absolute: 0.2 10*3/uL (ref 0.0–0.5)
Eosinophils Relative: 2 %
HCT: 39.2 % (ref 36.0–46.0)
Hemoglobin: 13.1 g/dL (ref 12.0–15.0)
Immature Granulocytes: 0 %
Lymphocytes Relative: 29 %
Lymphs Abs: 2.3 10*3/uL (ref 0.7–4.0)
MCH: 29.6 pg (ref 26.0–34.0)
MCHC: 33.4 g/dL (ref 30.0–36.0)
MCV: 88.7 fL (ref 80.0–100.0)
Monocytes Absolute: 0.6 10*3/uL (ref 0.1–1.0)
Monocytes Relative: 7 %
Neutro Abs: 4.8 10*3/uL (ref 1.7–7.7)
Neutrophils Relative %: 62 %
Platelets: 297 10*3/uL (ref 150–400)
RBC: 4.42 MIL/uL (ref 3.87–5.11)
RDW: 13.5 % (ref 11.5–15.5)
WBC: 7.9 10*3/uL (ref 4.0–10.5)
nRBC: 0 % (ref 0.0–0.2)

## 2023-11-12 LAB — HCG, SERUM, QUALITATIVE: Preg, Serum: NEGATIVE

## 2023-11-12 LAB — BASIC METABOLIC PANEL WITH GFR
Anion gap: 11 (ref 5–15)
BUN: 11 mg/dL (ref 6–20)
CO2: 24 mmol/L (ref 22–32)
Calcium: 10.1 mg/dL (ref 8.9–10.3)
Chloride: 105 mmol/L (ref 98–111)
Creatinine, Ser: 0.75 mg/dL (ref 0.44–1.00)
GFR, Estimated: >60 mL/min (ref >60)
Glucose, Bld: 94 mg/dL (ref 70–99)
Potassium: 4 mmol/L (ref 3.5–5.1)
Sodium: 140 mmol/L (ref 135–145)

## 2023-11-12 LAB — GROUP A STREP BY PCR: Group A Strep by PCR: NOT DETECTED

## 2023-11-12 MED ORDER — IOHEXOL 300 MG/ML  SOLN
100.0000 mL | Freq: Once | INTRAMUSCULAR | Status: AC | PRN
  Administered 2023-11-12: 100 mL via INTRAVENOUS

## 2023-11-12 NOTE — Discharge Instructions (Addendum)
 You were seen today for globus sensation.  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  They recommended that you follow-up with ENT.  Also did note some reactive lymphadenopathy.  Do not believe that the symptoms that you are experiencing today are due to any emergent cause and do not believe antibiotics or any other further medication is required at this time.  However recommend you continue to follow-up with ENT and return to the ED if you begin to have any new or worsening symptoms which include fever, trouble or difficulty swallowing, pain with swallowing, shortness of breath.

## 2023-11-12 NOTE — ED Provider Notes (Signed)
 [place] EMERGENCY DEPARTMENT AT [facility] Provider Note   CSN: [id number] Arrival date & time: 11/12/23  1612     History  Chief Complaint  Patient presents with   Oral Swelling    Killian MYCAH MCDOUGALL is a 23 y.o. female.  HPI Patient is a 23 year old female presents ED today with concerns of globus sensation, seen earlier by urgent care today and told to come to the ED for CT scan of neck to rule out deep space infection.  Noted this has been going on for approximately 1 week and has been associated with "uncomfortable" sensation to her right jaw which she noticed was a possible cyst development.  States that she thinks she also might be experiencing some mild right sided facial swelling.  Patient states that she has no other complaints this time.  Denies fever, headache, vision changes, vertigo, hearing loss, otalgia, dysphagia, odynophagia, cough, congestion, chest pain, shortness of breath, abdominal pain, nausea, vomiting, diarrhea, rash.    Home Medications Prior to Admission medications   Medication Sig Start Date End Date Taking? Authorizing Provider  escitalopram (LEXAPRO) 20 MG tablet Take 20 mg by mouth daily. 09/15/22   [provider]      Allergies    Cephalosporins    Review of Systems   Review of Systems  Constitutional:  Negative for chills and fever.  HENT:  Negative for ear pain and sore throat.        Globus sensation  Eyes:  Negative for pain and visual disturbance.  Respiratory:  Negative for cough and shortness of breath.   Cardiovascular:  Negative for chest pain and palpitations.  Gastrointestinal:  Negative for abdominal pain and vomiting.  Genitourinary:  Negative for dysuria and hematuria.  Musculoskeletal:  Negative for arthralgias and back pain.  Skin:  Negative for color change and rash.  Neurological:  Negative for seizures and syncope.  All other systems reviewed and are negative.   Physical Exam Updated Vital  Signs BP 94/74   Pulse 86   Temp 98.8 F (37.1 C) (Oral)   Resp 15   Ht 5\' 9"  (1.753 m)   Wt 133.8 kg   LMP 11/01/2023 (Approximate)   SpO2 94%   BMI 43.56 kg/m  Physical Exam Vitals and nursing note reviewed.  Constitutional:      General: She is not in acute distress.    Appearance: Normal appearance. She is not ill-appearing.  HENT:     Head: Normocephalic and atraumatic. No right periorbital erythema or left periorbital erythema.     Salivary Glands: Right salivary gland is not diffusely enlarged or tender. Left salivary gland is not diffusely enlarged or tender.     Right Ear: Hearing, tympanic membrane, ear canal and external ear normal.     Left Ear: Hearing, ear canal and external ear normal.     Ears:     Comments: Ruptured TM noted to left side, noted to be chronic by patient    Nose: Nose normal.     Mouth/Throat:     Mouth: Mucous membranes are moist.     Dentition: Normal dentition. Does not have dentures. No dental tenderness, gingival swelling, dental caries, dental abscesses or gum lesions.     Tongue: No lesions. Tongue does not deviate from midline.     Palate: No mass and lesions.     Pharynx: Oropharynx is clear. Uvula midline. No pharyngeal swelling, oropharyngeal exudate, posterior oropharyngeal erythema, uvula swelling or postnasal drip.  Tonsils: No tonsillar exudate or tonsillar abscesses.     Comments: No muffled voice.  Oropharynx appears normal otherwise. Eyes:     General: No scleral icterus.       Right eye: No discharge.        Left eye: No discharge.     Extraocular Movements: Extraocular movements intact.     Conjunctiva/sclera: Conjunctivae normal.  Cardiovascular:     Rate and Rhythm: Normal rate and regular rhythm.     Pulses: Normal pulses.     Heart sounds: Normal heart sounds. No murmur heard.    No friction rub. No gallop.  Pulmonary:     Effort: Pulmonary effort is normal. No respiratory distress.     Breath sounds: Normal  breath sounds.  Abdominal:     General: Abdomen is flat. There is no distension.     Palpations: Abdomen is soft.     Tenderness: There is no abdominal tenderness. There is no right CVA tenderness, left CVA tenderness or guarding.  Musculoskeletal:        General: No deformity or signs of injury.     Cervical back: No rigidity.     Right lower leg: No edema.     Left lower leg: No edema.  Skin:    General: Skin is warm and dry.     Coloration: Skin is not jaundiced or pale.     Findings: No bruising, erythema, lesion or rash.  Neurological:     General: No focal deficit present.     Mental Status: She is alert and oriented to person, place, and time. Mental status is at baseline.     Sensory: No sensory deficit.     Motor: No weakness.     Gait: Gait normal.  Psychiatric:        Mood and Affect: Mood normal.     ED Results / Procedures / Treatments   Labs (all labs ordered are listed, but only abnormal results are displayed) Labs Reviewed  GROUP A STREP BY PCR  CBC WITH DIFFERENTIAL/PLATELET  BASIC METABOLIC PANEL WITH GFR  HCG, SERUM, QUALITATIVE    EKG None  Radiology CT Soft Tissue Neck W Contrast Result Date: 11/12/2023 CLINICAL DATA:  Dysphagia with globus sensation EXAM: CT NECK WITH CONTRAST TECHNIQUE: Multidetector CT imaging of the neck was performed using the standard protocol following the bolus administration of intravenous contrast. RADIATION DOSE REDUCTION: This exam was performed according to the departmental dose-optimization program which includes automated exposure control, adjustment of the mA and/or kV according to patient size and/or use of iterative reconstruction technique. CONTRAST:  OMNIPAQUE IOHEXOL 300 MG/ML  SOLN COMPARISON:  CT cervical spine 08/13/2021 FINDINGS: Pharynx and larynx: Anterior to the thyroid  cartilage, there is a low-density lesion that extends superiorly to the level of the hyoid bone and measures 1.9 x 1.3 x 3.1 cm. The  structure is predominantly right-sided and just crosses the midline. Otherwise, pharyngeal in the Rinne G ule structures are normal. Salivary glands: No inflammation, mass, or stone. Thyroid : Normal Lymph nodes: There are 2 right level 2A lymph nodes that measure 10-11 mm each. Vascular: Normal Limited intracranial: Normal Visualized orbits: Normal Mastoids and visualized paranasal sinuses: Clear. Skeleton: No acute or aggressive process. Upper chest: Negative. Other: None. IMPRESSION: 1. A 1.9 x 1.3 x 3.1 cm low-density lesion anterior to the thyroid  cartilage, new/enlarged since 08/13/2021. 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 Follow-up to resolution recommended to exclude malignancy. 2.  Two right level 2A lymph nodes that measure 10-11 mm each, likely reactive. Electronically Signed   By: Juanetta Nordmann M.D.   On: 11/12/2023 18:58    Procedures Procedures    Medications Ordered in ED Medications  iohexol (OMNIPAQUE) 300 MG/ML solution 100 mL (100 mLs Intravenous Contrast Given 11/12/23 1829)    ED Course/ Medical Decision Making/ A&P                                 Medical Decision Making Amount and/or Complexity of Data Reviewed Labs: ordered. Radiology: ordered.   This patient is a 23 year old female who presents to the ED for concern of globus sensation.  For her to come here to the ED from urgent care to get a CT of neck.  On physical exam, patient is in no acute distress, afebrile, alert and orient x 4, speaking in full sentences, nontachypneic, nontachycardic.  Oropharynx was unremarkable with no sign of deep space infection, infectious etiology at this time.  Unremarkable exam otherwise.  Discussed with patient that do not believe that she would benefit much from a CT scan with this globus sensation being the main concern at this time.  However shared in decision making with patient and patient requested CT imaging of neck to rule out any sort of deep  space sensation or emergent cause for her globus sensation.  Do not believe strep or mononucleosis testing is warranted at this time with no erythema, exudate, odynophagia or dysphagia.  Will order baseline labs for CT and CT scan per patient request.  CT scan revealed a 2 x 3 cm lesion favored to be a thyroglossal duct cyst.  Low suspicion that this is infected at this time as patient is having no symptoms and her lab work is unremarkable.  Will have her follow-up with ENT for further monitoring of this and have her also follow-up with PCP.  Patient and mother expressed desire to leave at this time.  I believe this patient is a to be discharged at this time.  Patient vital signs have remained stable throughout the course of patient's time in the ED. Low suspicion for any other emergent pathology at this time. I believe this patient is safe to be discharged. Provided strict return to ER precautions. Patient expressed agreement and understanding of plan. All questions were answered.  Differential diagnoses prior to evaluation: The emergent differential diagnosis includes, but is not limited to, peritonsillar abscess, retropharyngeal abscess, proctitis, epiglottitis, Ludwigs angina, strep pharyngitis, mononucleosis, mass. This is not an exhaustive differential.   Past Medical History / Co-morbidities / Social History: Anxiety, depression, eczema, status post osteotomy  Additional history: Chart reviewed. Pertinent results include:  Noted to have been seen by urgent care today for dysphagia and "pulled muscle in neck" x 1 week. Told to come to ED for CT.   Seen on 10/27/2023 For strained trapezius muscle  Lab Tests/Imaging studies: I personally interpreted labs/imaging and the pertinent results include:   CBC unremarkable BMP unremarkable hCG serum qualitative negative Group strep unremarkable  CT soft tissues revealed a 2 x 3 cm lesion likely suspected to be a thyroglossal duct cyst with reactive  adenopathy. I agree with the radiologist interpretation.    Medications:   I have reviewed the patients home medicines and have made adjustments as needed.  Critical Interventions: None  Social Determinants of Health: None  Disposition: After consideration of the diagnostic results  and the patients response to treatment, I feel that the patient would benefit from Discharge and treatment as above.    emergency department workup does not suggest an emergent condition requiring admission or immediate intervention beyond what has been performed at this time. The plan is: Follow-up with ENT, follow-up PCP, return for new or worsening symptoms. The patient is safe for discharge and has been instructed to return immediately for worsening symptoms, change in symptoms or any other concerns.  Final Clinical Impression(s) / ED Diagnoses Final diagnoses:  Globus sensation    Rx / DC Orders ED Discharge Orders     None         Vevelyn Gowers 11/12/23 2058    Hershel Los, MD 11/12/23 2312

## 2023-11-12 NOTE — ED Triage Notes (Addendum)
 Pt POV reporting feeling like something is stuck in throat, uncomfortable to swallow, began about a week ago and has worsened. Respirations even and unlabored, airway intact, NAD noted at this time. Seen at [facility] and advised to come to ED for CT.

## 2023-11-17 ENCOUNTER — Institutional Professional Consult (permissible substitution) (INDEPENDENT_AMBULATORY_CARE_PROVIDER_SITE_OTHER): Admitting: Otolaryngology

## 2023-11-18 ENCOUNTER — Encounter (INDEPENDENT_AMBULATORY_CARE_PROVIDER_SITE_OTHER): Payer: Self-pay | Admitting: Otolaryngology

## 2023-11-18 ENCOUNTER — Ambulatory Visit (INDEPENDENT_AMBULATORY_CARE_PROVIDER_SITE_OTHER): Admitting: Otolaryngology

## 2023-11-18 VITALS — BP 114/73 | HR 83 | Ht 69.0 in | Wt 295.0 lb

## 2023-11-18 DIAGNOSIS — R09A2 Foreign body sensation, throat: Secondary | ICD-10-CM

## 2023-11-18 DIAGNOSIS — J309 Allergic rhinitis, unspecified: Secondary | ICD-10-CM

## 2023-11-18 DIAGNOSIS — J352 Hypertrophy of adenoids: Secondary | ICD-10-CM | POA: Diagnosis not present

## 2023-11-18 DIAGNOSIS — Q892 Congenital malformations of other endocrine glands: Secondary | ICD-10-CM

## 2023-11-18 DIAGNOSIS — H7292 Unspecified perforation of tympanic membrane, left ear: Secondary | ICD-10-CM | POA: Diagnosis not present

## 2023-11-18 MED ORDER — LEVOCETIRIZINE DIHYDROCHLORIDE 5 MG PO TABS: 5.0000 mg | ORAL_TABLET | Freq: Every evening | ORAL | 3 refills | Status: DC

## 2023-11-18 MED ORDER — FLUTICASONE PROPIONATE 50 MCG/ACT NA SUSP
2.0000 | Freq: Two times a day (BID) | NASAL | 6 refills | Status: DC
Start: 2023-11-18 — End: 2024-01-24
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
## 2024-01-05 ENCOUNTER — Encounter (HOSPITAL_BASED_OUTPATIENT_CLINIC_OR_DEPARTMENT_OTHER): Payer: Self-pay | Admitting: *Deleted

## 2024-01-05 ENCOUNTER — Other Ambulatory Visit: Payer: Self-pay

## 2024-01-09 NOTE — Anesthesia Preprocedure Evaluation (Signed)
 Anesthesia Evaluation  Patient identified by MRN, date of birth, ID band Patient awake    Reviewed: Allergy  & Precautions, NPO status , Patient's Chart, lab work & pertinent test results  History of Anesthesia Complications Negative for: history of anesthetic complications  Airway Mallampati: II  TM Distance: >3 FB Neck ROM: Full    Dental  (+) Dental Advisory Given   Pulmonary neg pulmonary ROS   breath sounds clear to auscultation       Cardiovascular (-) angina  Rhythm:Regular Rate:Normal  '24 ECHO: EF 60-65%, mild LVH, no significant valvular abnormalities   Neuro/Psych   Anxiety Depression    negative neurological ROS     GI/Hepatic negative GI ROS, Neg liver ROS,,,  Endo/Other    Class 4 obesityBMI 44  Renal/GU negative Renal ROS     Musculoskeletal   Abdominal   Peds  Hematology   Anesthesia Other Findings   Reproductive/Obstetrics                              Anesthesia Physical Anesthesia Plan  ASA: 3  Anesthesia Plan: General   Post-op Pain Management: Tylenol  PO (pre-op)*   Induction: Intravenous  PONV Risk Score and Plan: Ondansetron , Dexamethasone  and Scopolamine  patch - Pre-op  Airway Management Planned: Oral ETT  Additional Equipment: None  Intra-op Plan:   Post-operative Plan: Extubation in OR  Informed Consent: I have reviewed the patients History and Physical, chart, labs and discussed the procedure including the risks, benefits and alternatives for the proposed anesthesia with the patient or authorized representative who has indicated his/her understanding and acceptance.     Dental advisory given  Plan Discussed with: CRNA and Surgeon  Anesthesia Plan Comments:          Anesthesia Quick Evaluation

## 2024-01-10 ENCOUNTER — Other Ambulatory Visit: Payer: Self-pay

## 2024-01-10 ENCOUNTER — Encounter (HOSPITAL_BASED_OUTPATIENT_CLINIC_OR_DEPARTMENT_OTHER): Payer: Self-pay

## 2024-01-10 ENCOUNTER — Encounter (HOSPITAL_BASED_OUTPATIENT_CLINIC_OR_DEPARTMENT_OTHER)
Admission: RE | Disposition: A | Discharge status: Home / Self Care | Payer: Self-pay | Source: Home / Self Care | Attending: Otolaryngology

## 2024-01-10 ENCOUNTER — Ambulatory Visit (HOSPITAL_BASED_OUTPATIENT_CLINIC_OR_DEPARTMENT_OTHER): Payer: Self-pay | Admitting: Anesthesiology

## 2024-01-10 ENCOUNTER — Ambulatory Visit (HOSPITAL_BASED_OUTPATIENT_CLINIC_OR_DEPARTMENT_OTHER)
Admission: RE | Admit: 2024-01-10 | Discharge: 2024-01-10 | Disposition: A | Discharge status: Home / Self Care | Attending: Otolaryngology | Admitting: Otolaryngology

## 2024-01-10 DIAGNOSIS — H7292 Unspecified perforation of tympanic membrane, left ear: Secondary | ICD-10-CM | POA: Diagnosis not present

## 2024-01-10 DIAGNOSIS — E6689 Other obesity not elsewhere classified: Secondary | ICD-10-CM | POA: Diagnosis not present

## 2024-01-10 DIAGNOSIS — Z6841 Body Mass Index (BMI) 40.0 and over, adult: Secondary | ICD-10-CM | POA: Diagnosis not present

## 2024-01-10 DIAGNOSIS — Z79899 Other long term (current) drug therapy: Secondary | ICD-10-CM | POA: Insufficient documentation

## 2024-01-10 DIAGNOSIS — F419 Anxiety disorder, unspecified: Secondary | ICD-10-CM | POA: Diagnosis not present

## 2024-01-10 DIAGNOSIS — Q892 Congenital malformations of other endocrine glands: Secondary | ICD-10-CM

## 2024-01-10 DIAGNOSIS — Z01818 Encounter for other preprocedural examination: Secondary | ICD-10-CM

## 2024-01-10 DIAGNOSIS — F418 Other specified anxiety disorders: Secondary | ICD-10-CM

## 2024-01-10 DIAGNOSIS — F32A Depression, unspecified: Secondary | ICD-10-CM | POA: Diagnosis not present

## 2024-01-10 DIAGNOSIS — R09A2 Foreign body sensation, throat: Secondary | ICD-10-CM | POA: Diagnosis present

## 2024-01-10 DIAGNOSIS — E041 Nontoxic single thyroid nodule: Secondary | ICD-10-CM | POA: Diagnosis present

## 2024-01-10 HISTORY — PX: MYRINGOPLASTY W/ PAPER PATCH: SHX2059

## 2024-01-10 HISTORY — PX: THYROGLOSSAL DUCT CYST: SHX297

## 2024-01-10 LAB — POCT PREGNANCY, URINE: Preg Test, Ur: NEGATIVE

## 2024-01-10 SURGERY — EXCISION, THYROGLOSSAL DUCT CYST
Anesthesia: General | Site: Neck

## 2024-01-10 MED ORDER — ONDANSETRON HCL 4 MG/2ML IJ SOLN
INTRAMUSCULAR | Status: DC | PRN
  Administered 2024-01-10: 4 mg via INTRAVENOUS

## 2024-01-10 MED ORDER — SCOPOLAMINE 1 MG/3DAYS TD PT72
1.0000 | MEDICATED_PATCH | TRANSDERMAL | Status: DC
  Administered 2024-01-10: 1.5 mg via TRANSDERMAL

## 2024-01-10 MED ORDER — LIDOCAINE-EPINEPHRINE 1 %-1:100000 IJ SOLN
INTRAMUSCULAR | Status: AC
Start: 2024-01-10 — End: 2024-01-10
  Filled 2024-01-10: qty 1

## 2024-01-10 MED ORDER — OXYCODONE HCL 5 MG PO TABS
ORAL_TABLET | ORAL | Status: AC
Start: 2024-01-10 — End: 2024-01-10
  Filled 2024-01-10: qty 1

## 2024-01-10 MED ORDER — FENTANYL CITRATE (PF) 100 MCG/2ML IJ SOLN
INTRAMUSCULAR | Status: DC | PRN
  Administered 2024-01-10: 25 ug via INTRAVENOUS
  Administered 2024-01-10: 50 ug via INTRAVENOUS
  Administered 2024-01-10: 100 ug via INTRAVENOUS
  Administered 2024-01-10: 25 ug via INTRAVENOUS

## 2024-01-10 MED ORDER — ROCURONIUM BROMIDE 10 MG/ML (PF) SYRINGE
PREFILLED_SYRINGE | INTRAVENOUS | Status: DC | PRN
  Administered 2024-01-10: 60 mg via INTRAVENOUS

## 2024-01-10 MED ORDER — CIPROFLOXACIN-DEXAMETHASONE 0.3-0.1 % OT SUSP
OTIC | Status: AC
  Filled 2024-01-10: qty 7.5

## 2024-01-10 MED ORDER — MIDAZOLAM HCL 2 MG/2ML IJ SOLN
INTRAMUSCULAR | Status: AC
  Filled 2024-01-10: qty 2

## 2024-01-10 MED ORDER — PROPOFOL 10 MG/ML IV BOLUS
INTRAVENOUS | Status: DC | PRN
  Administered 2024-01-10: 50 mg via INTRAVENOUS
  Administered 2024-01-10: 200 mg via INTRAVENOUS

## 2024-01-10 MED ORDER — OXYMETAZOLINE HCL 0.05 % NA SOLN
NASAL | Status: AC
  Filled 2024-01-10: qty 30

## 2024-01-10 MED ORDER — MIDAZOLAM HCL 2 MG/2ML IJ SOLN
INTRAMUSCULAR | Status: DC | PRN
  Administered 2024-01-10: 2 mg via INTRAVENOUS

## 2024-01-10 MED ORDER — SCOPOLAMINE 1 MG/3DAYS TD PT72
MEDICATED_PATCH | TRANSDERMAL | Status: AC
  Filled 2024-01-10: qty 1

## 2024-01-10 MED ORDER — CLINDAMYCIN PHOSPHATE 900 MG/50ML IV SOLN
900.0000 mg | INTRAVENOUS | Status: AC
  Administered 2024-01-10: 900 mg via INTRAVENOUS

## 2024-01-10 MED ORDER — MIDAZOLAM HCL 2 MG/2ML IJ SOLN: 0.5000 mg | Freq: Once | INTRAMUSCULAR | Status: DC | PRN

## 2024-01-10 MED ORDER — 0.9 % SODIUM CHLORIDE (POUR BTL) OPTIME
TOPICAL | Status: DC | PRN
  Administered 2024-01-10: 120 mL

## 2024-01-10 MED ORDER — BACITRACIN 500 UNIT/GM EX OINT
TOPICAL_OINTMENT | CUTANEOUS | Status: DC | PRN
  Administered 2024-01-10: 1 via TOPICAL

## 2024-01-10 MED ORDER — CLINDAMYCIN PHOSPHATE 900 MG/50ML IV SOLN
INTRAVENOUS | Status: AC
Start: 2024-01-10 — End: 2024-01-10
  Filled 2024-01-10: qty 50

## 2024-01-10 MED ORDER — ACETAMINOPHEN 500 MG PO TABS
ORAL_TABLET | ORAL | Status: AC
  Filled 2024-01-10: qty 2

## 2024-01-10 MED ORDER — DEXMEDETOMIDINE HCL IN NACL 80 MCG/20ML IV SOLN
INTRAVENOUS | Status: AC
  Filled 2024-01-10: qty 20

## 2024-01-10 MED ORDER — BACITRACIN ZINC 500 UNIT/GM EX OINT
TOPICAL_OINTMENT | CUTANEOUS | Status: AC
  Filled 2024-01-10: qty 0.9

## 2024-01-10 MED ORDER — DEXAMETHASONE SODIUM PHOSPHATE 10 MG/ML IJ SOLN
INTRAMUSCULAR | Status: DC | PRN
  Administered 2024-01-10: 10 mg via INTRAVENOUS

## 2024-01-10 MED ORDER — DEXMEDETOMIDINE HCL IN NACL 80 MCG/20ML IV SOLN
INTRAVENOUS | Status: DC | PRN
Start: 2024-01-10 — End: 2024-01-10
  Administered 2024-01-10 (×2): 4 ug via INTRAVENOUS

## 2024-01-10 MED ORDER — HYDROMORPHONE HCL 1 MG/ML IJ SOLN
0.2500 mg | INTRAMUSCULAR | Status: DC | PRN
  Administered 2024-01-10: 0.5 mg via INTRAVENOUS

## 2024-01-10 MED ORDER — ACETAMINOPHEN 500 MG PO TABS
1000.0000 mg | ORAL_TABLET | Freq: Once | ORAL | Status: AC
  Administered 2024-01-10: 1000 mg via ORAL

## 2024-01-10 MED ORDER — OXYCODONE HCL 5 MG PO TABS: 5.0000 mg | ORAL_TABLET | Freq: Three times a day (TID) | ORAL | 0 refills | Status: AC | PRN

## 2024-01-10 MED ORDER — OXYCODONE HCL 5 MG/5ML PO SOLN: 5.0000 mg | Freq: Once | ORAL | Status: AC | PRN

## 2024-01-10 MED ORDER — ACETAMINOPHEN 500 MG PO TABS: 500.0000 mg | ORAL_TABLET | Freq: Four times a day (QID) | ORAL | 0 refills | Status: AC

## 2024-01-10 MED ORDER — IBUPROFEN 600 MG PO TABS: 600.0000 mg | ORAL_TABLET | Freq: Four times a day (QID) | ORAL | 0 refills | Status: AC

## 2024-01-10 MED ORDER — LIDOCAINE 2% (20 MG/ML) 5 ML SYRINGE
INTRAMUSCULAR | Status: DC | PRN
  Administered 2024-01-10: 25 mg via INTRAVENOUS

## 2024-01-10 MED ORDER — SUGAMMADEX SODIUM 200 MG/2ML IV SOLN
INTRAVENOUS | Status: DC | PRN
  Administered 2024-01-10: 200 mg via INTRAVENOUS

## 2024-01-10 MED ORDER — MEPERIDINE HCL 25 MG/ML IJ SOLN: 6.2500 mg | INTRAMUSCULAR | Status: DC | PRN

## 2024-01-10 MED ORDER — FENTANYL CITRATE (PF) 100 MCG/2ML IJ SOLN
INTRAMUSCULAR | Status: AC
Start: 2024-01-10 — End: 2024-01-10
  Filled 2024-01-10: qty 2

## 2024-01-10 MED ORDER — OXYCODONE HCL 5 MG PO TABS
5.0000 mg | ORAL_TABLET | Freq: Once | ORAL | Status: AC | PRN
  Administered 2024-01-10: 5 mg via ORAL

## 2024-01-10 MED ORDER — FENTANYL CITRATE (PF) 100 MCG/2ML IJ SOLN
INTRAMUSCULAR | Status: AC
  Filled 2024-01-10: qty 2

## 2024-01-10 MED ORDER — HYDROMORPHONE HCL 1 MG/ML IJ SOLN
INTRAMUSCULAR | Status: AC
  Filled 2024-01-10: qty 0.5

## 2024-01-10 MED ORDER — LACTATED RINGERS IV SOLN: INTRAVENOUS | Status: DC

## 2024-01-10 MED ORDER — LIDOCAINE-EPINEPHRINE 1 %-1:100000 IJ SOLN
INTRAMUSCULAR | Status: DC | PRN
  Administered 2024-01-10: 3 mL

## 2024-01-10 MED ORDER — PROPOFOL 500 MG/50ML IV EMUL
INTRAVENOUS | Status: AC
  Filled 2024-01-10: qty 100

## 2024-01-10 MED ORDER — DOXYCYCLINE HYCLATE 100 MG PO TABS: 100.0000 mg | ORAL_TABLET | Freq: Two times a day (BID) | ORAL | 0 refills | Status: AC

## 2024-01-10 SURGICAL SUPPLY — 77 items
BAND RUBBER #18 3X1/16 STRL (MISCELLANEOUS) IMPLANT
BENZOIN TINCTURE PRP APPL 2/3 (GAUZE/BANDAGES/DRESSINGS) IMPLANT
BLADE SURG 15 STRL LF DISP TIS (BLADE) ×4 IMPLANT
CANISTER SUCT 1200ML W/VALVE (MISCELLANEOUS) ×2 IMPLANT
CLEANER CAUTERY TIP PAD (MISCELLANEOUS) IMPLANT
CLIP TI WIDE RED SMALL 6 (CLIP) ×2 IMPLANT
CORD BIPOLAR FORCEPS 12FT (ELECTRODE) ×2 IMPLANT
COTTONBALL LRG STERILE PKG (GAUZE/BANDAGES/DRESSINGS) ×2 IMPLANT
COVER BACK TABLE 60X90IN (DRAPES) ×2 IMPLANT
COVER MAYO STAND STRL (DRAPES) ×2 IMPLANT
DERMABOND ADVANCED .7 DNX12 (GAUZE/BANDAGES/DRESSINGS) ×2 IMPLANT
DRAIN PENROSE 12X.25 LTX STRL (MISCELLANEOUS) IMPLANT
DRAIN WOUND RND W/TROCAR (DRAIN) IMPLANT
DRAPE INCISE IOBAN 66X45 STRL (DRAPES) IMPLANT
DRAPE U-SHAPE 76X120 STRL (DRAPES) ×2 IMPLANT
DRSG EMULSION OIL 3X3 NADH (GAUZE/BANDAGES/DRESSINGS) IMPLANT
DRSG TEGADERM 2-3/8X2-3/4 SM (GAUZE/BANDAGES/DRESSINGS) ×2 IMPLANT
DRSG TEGADERM 4X4.75 (GAUZE/BANDAGES/DRESSINGS) ×2 IMPLANT
DRSG TELFA 3X8 NADH STRL (GAUZE/BANDAGES/DRESSINGS) ×2 IMPLANT
ELECT COATED BLADE 2.86 ST (ELECTRODE) ×2 IMPLANT
ELECT NDL BLADE 2-5/6 (NEEDLE) IMPLANT
ELECT NEEDLE BLADE 2-5/6 (NEEDLE) IMPLANT
ELECTRODE REM PT RTRN 9FT ADLT (ELECTROSURGICAL) ×2 IMPLANT
EVACUATOR SILICONE 100CC (DRAIN) IMPLANT
FORCEPS BIPOLAR SPETZLER 8 1.0 (NEUROSURGERY SUPPLIES) ×2 IMPLANT
GAUZE 4X4 16PLY ~~LOC~~+RFID DBL (SPONGE) IMPLANT
GAUZE SPONGE 4X4 12PLY STRL LF (GAUZE/BANDAGES/DRESSINGS) IMPLANT
GLOVE BIO SURGEON STRL SZ 6 (GLOVE) ×2 IMPLANT
GLOVE BIO SURGEON STRL SZ7.5 (GLOVE) IMPLANT
GLOVE BIOGEL PI IND STRL 7.0 (GLOVE) IMPLANT
GLOVE BIOGEL PI IND STRL 7.5 (GLOVE) IMPLANT
GLOVE SURG SS PI 7.0 STRL IVOR (GLOVE) IMPLANT
GOWN STRL REUS W/ TWL LRG LVL3 (GOWN DISPOSABLE) ×4 IMPLANT
GOWN STRL REUS W/ TWL XL LVL3 (GOWN DISPOSABLE) IMPLANT
HEMOSTAT SURGICEL .5X2 ABSORB (HEMOSTASIS) IMPLANT
HOOK RETRACT STAY BLUNT 12 (MISCELLANEOUS) IMPLANT
IV SET EXT 30 76VOL 4 MALE LL (IV SETS) ×2 IMPLANT
MAT PREVALON FULL STRYKER (MISCELLANEOUS) IMPLANT
NDL PRECISIONGLIDE 27X1.5 (NEEDLE) ×2 IMPLANT
NEEDLE PRECISIONGLIDE 27X1.5 (NEEDLE) ×2 IMPLANT
NS IRRIG 1000ML POUR BTL (IV SOLUTION) ×2 IMPLANT
PACK BASIN DAY SURGERY FS (CUSTOM PROCEDURE TRAY) ×2 IMPLANT
PENCIL SMOKE EVACUATOR (MISCELLANEOUS) ×2 IMPLANT
PIN SAFETY STERILE (MISCELLANEOUS) ×2 IMPLANT
RETRACTOR STAY HOOK 5MM (MISCELLANEOUS) IMPLANT
SHEARS HARMONIC 9CM CVD (BLADE) IMPLANT
SHEET MEDIUM DRAPE 40X70 STRL (DRAPES) IMPLANT
SLEEVE SCD COMPRESS KNEE MED (STOCKING) ×2 IMPLANT
SPIKE FLUID TRANSFER (MISCELLANEOUS) IMPLANT
SPONGE INTESTINAL PEANUT (DISPOSABLE) ×2 IMPLANT
STAPLER SKIN PROX WIDE 3.9 (STAPLE) ×2 IMPLANT
STRIP CLOSURE SKIN 1/2X4 (GAUZE/BANDAGES/DRESSINGS) IMPLANT
STRIP CLOSURE SKIN 1/4X4 (GAUZE/BANDAGES/DRESSINGS) IMPLANT
SUCTION TUBE FRAZIER 10FR DISP (SUCTIONS) IMPLANT
SUT CHROMIC 3 0 PS 2 (SUTURE) IMPLANT
SUT ETHILON 3 0 PS 1 (SUTURE) IMPLANT
SUT ETHILON 5 0 PS 2 18 (SUTURE) IMPLANT
SUT MON AB 5-0 PS2 18 (SUTURE) ×2 IMPLANT
SUT NYLON ETHILON 5-0 P-3 1X18 (SUTURE) IMPLANT
SUT SILK 2 0 SH CR/8 (SUTURE) IMPLANT
SUT SILK 3 0 PS 1 (SUTURE) IMPLANT
SUT SILK 3 0 REEL (SUTURE) ×2 IMPLANT
SUT SILK 3 0 SH CR/8 (SUTURE) IMPLANT
SUT SILK 3-0 18XBRD TIE BLK (SUTURE) IMPLANT
SUT SILK 4 0 TIES 17X18 (SUTURE) ×2 IMPLANT
SUT VIC AB 3-0 SH 27X BRD (SUTURE) ×2 IMPLANT
SUT VIC AB 4-0 P-3 18XBRD (SUTURE) IMPLANT
SUT VIC AB 4-0 PS2 27 (SUTURE) IMPLANT
SUT VIC AB 5-0 P-3 18X BRD (SUTURE) IMPLANT
SUT VICRYL RAPID 5 0 P 3 (SUTURE) IMPLANT
SWAB COLLECTION DEVICE MRSA (MISCELLANEOUS) IMPLANT
SWAB CULTURE ESWAB REG 1ML (MISCELLANEOUS) IMPLANT
SYR BULB EAR ULCER 3OZ GRN STR (SYRINGE) ×2 IMPLANT
SYR CONTROL 10ML LL (SYRINGE) ×2 IMPLANT
TOWEL GREEN STERILE FF (TOWEL DISPOSABLE) ×4 IMPLANT
TRAY DSU PREP LF (CUSTOM PROCEDURE TRAY) ×2 IMPLANT
TUBE CONNECTING 20X1/4 (TUBING) ×2 IMPLANT

## 2024-01-10 NOTE — Anesthesia Postprocedure Evaluation (Signed)
 Anesthesia Post Note  Patient: Krista Dennis  Procedure(s) Performed: EXCISION, THYROGLOSSAL DUCT CYST (Neck) MYRINGOPLASTY, PAPER PATCH (Left: Ear)     Patient location during evaluation: Phase II Anesthesia Type: General Level of consciousness: awake and alert, oriented and patient cooperative Pain management: pain level controlled Vital Signs Assessment: post-procedure vital signs reviewed and stable Respiratory status: spontaneous breathing, nonlabored ventilation and respiratory function stable Cardiovascular status: blood pressure returned to baseline and stable Postop Assessment: no apparent nausea or vomiting, adequate PO intake and able to ambulate Anesthetic complications: no   No notable events documented.  Last Vitals:  Vitals:   01/10/24 1115 01/10/24 1147  BP: 122/72 139/89  Pulse: 84 83  Resp: 14 16  Temp:  (!) 36.2 C  SpO2: 99% 95%    Last Pain:  Vitals:   01/10/24 1138  TempSrc:   PainSc: 8                  Bedelia Pong,E. Keyatta Tolles

## 2024-01-10 NOTE — Transfer of Care (Signed)
 Immediate Anesthesia Transfer of Care Note  Patient: [name]  Procedure(s) Performed: EXCISION, THYROGLOSSAL DUCT CYST (Neck) MYRINGOPLASTY, PAPER PATCH (Left: Ear)  Patient Location: PACU  Anesthesia Type:General  Level of Consciousness: drowsy and patient cooperative  Airway & Oxygen Therapy: Patient Spontanous Breathing and Patient connected to face mask oxygen  Post-op Assessment: Report given to RN and Post -op Vital signs reviewed and stable  Post vital signs: Reviewed and stable  Last Vitals:  Vitals Value Taken Time  BP 126/74 01/10/24 10:35  Temp    Pulse 79 01/10/24 10:38  Resp 19 01/10/24 10:38  SpO2 100 % 01/10/24 10:38  Vitals shown include unfiled device data.  Last Pain:  Vitals:   01/10/24 0754  TempSrc: Oral  PainSc: 0-No pain      Patients Stated Pain Goal: 4 (01/10/24 0754)  Complications: No notable events documented.

## 2024-01-10 NOTE — Discharge Instructions (Addendum)
 This post-operative instruction sheet is designed to help you after surgery and help answer any of the common questions you may have. It is not entirely comprehensive, so if you have any questions, do not hesitate to call the office 24 hours a day.  What to expect: - You will see swelling or bruising under the incision in a few days. This is usually greatest on the second or third day after the operation. You also may feel the sensation of swelling and/or firmness which can last for a month or more. - Most patients experience very little pain from the incision and may complain more about a sore throat from the breathing tube. You may experience neck stiffness or soreness in your shoulders, back or neck. - Some patients also notice minor changes in swallowing, which improve over time. - The skin just above and below your incision will feel numb. This will improve over several months though some patients may have a permanent decrease in sensation in this area. - Your voice may be slightly hoarse or weak after surgery. This is normal and does not mean there was damage to the nerves that make the vocal cords move. The breathing tube used during surgery often irritates the vocal cords. Your voice usually will go back to normal within several days to a few weeks.   Wound Care and Hygiene:  Hygiene - You may shower/bathe 24-48 hours after surgery.  Do not let the water from the shower stream hit the incision directly. Do not soak incision until you are seen in follow up.  Incision care: - A special skin glue called Dermabond with Steri-strips is used to close and cover your incision. It is to stay in place for 10 to 14 days and wears off on its own. The glue is waterproof but you should not soak your incision or take a tub bath for 2 weeks because it could loosen the glue too soon. Do not pick or peel the glue off your skin. You should remove your post-operative dressing the day after your surgery. - You do  not need to cover the incision with saran wrap to shower since the dressing is waterproof. - Do not apply ointments or powders to the incision. - No further care is needed unless you have a problem. - The incision will be raised and red, but will eventually fade over the course of a year. - All incisions are sensitive to sunlight. For one year after surgery, you should use sunscreen when outdoors for long periods to prevent darkening of the scar area.  Nutrition after Surgery: - Start your regular diet today. No dietary restrictions after this  Activity: - Limit your activity for 24 hours. - Avoid vigorous physical activity while the stitches are in place - this includes heavy lifting, running, and other sporting activities for at least 2 weeks. Avoid activities that pull or stretch on the area with stitches. - Sleep with head or surgery site elevated using several pillows when possible.   Medications: - Pain Medication - A prescription for pain medicine Oxycodone  will be sent home with you. Do not drive while taking prescription pain medicine (narcotic pain medication) and only take it if you need it. Eat when taking pain medicines to avoid nausea. Watch for constipation. Eat plenty of fruits, vegetables, juices, and drink 6 to 8 glasses of water each day. - Take scheduled Tylenol  and Motrin  every 6  hrs staggered 3 hrs apart from each other. Follow the dose  as label directs. If you still having significant pain, take Oxycodone .  - Take an over the counter stool softeners twice daily while taking the narcotic pain medicine to prevent constipation.  Keep left ear dry and showed with cotton ball in the ear canal until instructed otherwise by your physician    Post Anesthesia Home Care Instructions  Activity: Get plenty of rest for the remainder of the day. A responsible individual must stay with you for 24 hours following the procedure.  For the next 24 hours, DO NOT: -Drive a  car -Advertising copywriter -Drink alcoholic beverages -Take any medication unless instructed by your physician -Make any legal decisions or sign important papers.  Meals: Start with liquid foods such as gelatin or soup. Progress to regular foods as tolerated. Avoid greasy, spicy, heavy foods. If nausea and/or vomiting occur, drink only clear liquids until the nausea and/or vomiting subsides. Call your physician if vomiting continues.  Special Instructions/Symptoms: Your throat may feel dry or sore from the anesthesia or the breathing tube placed in your throat during surgery. If this causes discomfort, gargle with warm salt water. The discomfort should disappear within 24 hours.  If you had a scopolamine  patch placed behind your ear for the management of post- operative nausea and/or vomiting:  1. The medication in the patch is effective for 72 hours, after which it should be removed.  Wrap patch in a tissue and discard in the trash. Wash hands thoroughly with soap and water. 2. You may remove the patch earlier than 72 hours if you experience unpleasant side effects which may include dry mouth, dizziness or visual disturbances. 3. Avoid touching the patch. Wash your hands with soap and water after contact with the patch.    May have Tylenol  after 2:00 pm if needed.

## 2024-01-10 NOTE — Op Note (Signed)
 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 Her exam also revealed left ear drum perforation. She was therefore mentioned the aforementioned procedures, and after discussion of risks and benefits, she elected to proceed.   The patient was identified in the holding room, and informed consent was obtained.  The patient was then brought to the operating room and placed supine on the table. General anesthesia was induced and the patient was orotracheally intubated. A prep verification and time out were performed. Preoperative antibiotics were administered.  A shoulder roll was placed. The planned neck incision was marked and  infiltrated with 1% lidocaine  with 1:100,000 epinephrine .  The patient was then prepped and draped in the standard sterile fashion.   The incision was made through the skin and subcutaneous tissues with a 15 blade.  The platysma was divided and flaps elevated superiorly and inferiorly.  The neck mass was visualized and palpapated.  It was dissected with a combination of blunt and sharp dissection.  The cyst was released inferiorly leaving a cuff a strap muscle attached to the cyst where adherent.  The thyroid  notch of the thyroid  cartilage was identified and freed from the cyst. The dissection was carried superiorly to the hyoid bone.  Bovie cautery was used to detach the musculature from the insertion on the superior middle third of the hyoid bone.   The central third of the hyoid bone was removed and dissection was carried superiorly, to remove a cuff of central tongue base musculature.  The cyst, strap  muscles, hyoid and a track of tongue musculature were removed en bloc and passed off the field as specimen. The wound bed was copiously irrigated and meticulous hemostasis was achieved.    A 1/4 inch penrose drain was placed at the tongue base defect and sutured in place with a 3-0 nylon.   The wound was then closed in layers.  The strap muscles were reapproximated in the midline with interrupted 4-0 vicryl.  The platysma/deep dermal layer was closed with interrupted 4-0 Vicryl.  The skin was closed with 5-0 subcuticular Monocryl.  Dermabond was placed over the incision.   Next we positioned the patient for the paper patch and brought in a microscope. After visualizing a left tympanic membrane, there was evidence of an anterior inferior perforation approximately 25% of the tympanic membrane in size. Using a pick we freshened the edges of the perforation until it bled, and then laid a circular paper patch over the perforation. A cotton ball with antibiotic ointment was placed in the ear canal   The patient was returned to the care of the anesthesia staff, awakened, and transferred to recovery room in good condition.

## 2024-01-10 NOTE — H&P (Signed)
 Krista Dennis is an 23 y.o. female.    Chief Complaint:  TGDC and TM perforation  HPI: Patient presents today for planned elective procedure.  He/she denies any interval change in history since office visit on 11/18/23  Past Medical History:  Diagnosis Date   Anxiety    Depression    Eczema     Past Surgical History:  Procedure Laterality Date   OSTEOTOMY  08/04/2023   WISDOM TOOTH EXTRACTION  2017    Family History  Problem Relation Age of Onset   Angioedema Mother    Allergic rhinitis Mother    Asthma Sister    Eczema Sister    Allergic rhinitis Sister    Heart disease Paternal Grandfather    Urticaria Neg Hx     Social History:  reports that she has never smoked. She has been exposed to tobacco smoke. She has never used smokeless tobacco. She reports current alcohol use. She reports that she does not use drugs.  Allergies:  Allergies  Allergen Reactions   Cephalosporins Hives    Medications Prior to Admission  Medication Sig Dispense Refill   escitalopram (LEXAPRO) 20 MG tablet Take 20 mg by mouth daily.     fluticasone  (FLONASE ) 50 MCG/ACT nasal spray Place 2 sprays into both nostrils 2 (two) times daily. 16 g 6   levocetirizine (XYZAL  ALLERGY  24HR) 5 MG tablet Take 1 tablet (5 mg total) by mouth every evening. 30 tablet 3    No results found for this or any previous visit (from the past 48 hours). No results found.  ROS:  ROS: Constitutional: Negative for fever, weight loss and weight gain. Cardiovascular: Negative for chest pain and dyspnea on exertion. Respiratory: Is not experiencing shortness of breath at rest. Gastrointestinal: Negative for nausea and vomiting. Neurological: Negative for headaches. Psychiatric: The patient is not nervous/anxious   Height 5' 9 (1.753 m), weight 133.8 kg, last menstrual period 12/29/2023.  PHYSICAL EXAM: Exam: General: Well-developed, well-nourished Respiratory Respiratory effort: Equal inspiration and  expiration without stridor Cardiovascular Peripheral Vascular: Warm extremities with equal color/perfusion Eyes: No nystagmus with equal extraocular motion bilaterally Neuro/Psych/Balance: Patient oriented to person, place, and time; Appropriate mood and affect; Gait is intact with no imbalance; Cranial nerves I-XII are intact Head and Face Inspection: Normocephalic and atraumatic without mass or lesion Palpation: Facial skeleton intact without bony stepoffs Salivary Glands: No mass or tenderness  Facial Strength: Facial motility symmetric and full bilaterally ENT Pinna: External ear intact and fully developed External canal: Canal is patent with intact skin Tympanic Membrane: Clear and mobile External Nose: No scar or anatomic deformity Internal Nose: Septum is relatively straight on anterior rhinoscopy. Bilateral inferior turbinate hypertrophy.  Lips, Teeth, and gums: Mucosa and teeth intact and viable Oral cavity/oropharynx: No erythema or exudate, no lesions present Neck Neck and Trachea: Midline trachea without mass or lesion Thyroid : No mass or nodularity Lymphatics: No lymphadenopathy    Assessment/Plan TGDC  L TM perforation  - risks and benefits of surgery discussed and he would like to proceed    Krista Dennis 01/10/2024, 7:28 AM

## 2024-01-10 NOTE — Addendum Note (Signed)
 Addendum  created 01/10/24 1225 by Leotha Andrez DEL, CRNA   Intraprocedure Meds edited

## 2024-01-10 NOTE — Anesthesia Procedure Notes (Signed)
 Procedure Name: Intubation Date/Time: 01/10/2024 9:11 AM  Performed by: Leotha Andrez DEL, CRNAPre-anesthesia Checklist: Patient identified, Emergency Drugs available, Suction available, Patient being monitored and Timeout performed Patient Re-evaluated:Patient Re-evaluated prior to induction Oxygen Delivery Method: Circle system utilized Preoxygenation: Pre-oxygenation with 100% oxygen Induction Type: IV induction Ventilation: Mask ventilation without difficulty Laryngoscope Size: Mac and 4 Grade View: Grade I Tube type: Oral Tube size: 7.0 mm Number of attempts: 1 Airway Equipment and Method: Stylet Placement Confirmation: ETT inserted through vocal cords under direct vision, positive ETCO2 and breath sounds checked- equal and bilateral Secured at: 22 cm Tube secured with: Tape Dental Injury: Teeth and Oropharynx as per pre-operative assessment

## 2024-01-11 ENCOUNTER — Encounter (HOSPITAL_BASED_OUTPATIENT_CLINIC_OR_DEPARTMENT_OTHER): Payer: Self-pay | Admitting: Otolaryngology

## 2024-01-12 ENCOUNTER — Encounter (INDEPENDENT_AMBULATORY_CARE_PROVIDER_SITE_OTHER): Payer: Self-pay | Admitting: Otolaryngology

## 2024-01-12 ENCOUNTER — Ambulatory Visit (INDEPENDENT_AMBULATORY_CARE_PROVIDER_SITE_OTHER): Admitting: Otolaryngology

## 2024-01-12 VITALS — BP 117/79 | HR 70

## 2024-01-12 DIAGNOSIS — Z9889 Other specified postprocedural states: Secondary | ICD-10-CM

## 2024-01-12 DIAGNOSIS — Q892 Congenital malformations of other endocrine glands: Secondary | ICD-10-CM

## 2024-01-12 LAB — SURGICAL PATHOLOGY

## 2024-01-12 NOTE — Progress Notes (Signed)
 ENT progress note   Here for drain removal s/p TGDC removal and left TM paper patch 2 days ago. She has pain at night, takes Oxycodone  for it. Some serosanguinous drainage from Penrose, gradually decreased in size.   Exam: General: Well-developed, well-nourished Respiratory Respiratory effort: Equal inspiration and expiration without stridor Cardiovascular Peripheral Vascular: Warm extremities with equal color/perfusion Eyes: No nystagmus with equal extraocular motion bilaterally Neuro/Psych/Balance: Patient oriented to person, place, and time; Appropriate mood and affect; Gait is intact with no imbalance; Cranial nerves I-XII are intact Head and Face Inspection: Normocephalic and atraumatic without mass or lesion Palpation: Facial skeleton intact without bony stepoffs Salivary Glands: No mass or tenderness  Facial Strength: Facial motility symmetric and full bilaterally ENT Pinna: External ear intact and fully developed External canal: Canal is patent with intact skin Tympanic Membrane: left side with a paper patch in place External Nose: No scar or anatomic deformity Internal Nose: Septum is relatively straight on anterior rhinoscopy. Bilateral inferior turbinate hypertrophy.  Lips, Teeth, and gums: Mucosa and teeth intact and viable Oral cavity/oropharynx: No erythema or exudate, no lesions present Neck Neck and Trachea: Midline trachea without mass or lesion. Anterior neck incision clean and dry, minimal output from Penrose, removed and dressing re-applied.  Thyroid : No mass or nodularity Lymphatics: No lymphadenopathy   A/P S/p TGDC removal, doing well   - continue current pain regimen - Penrose removed - return as scheduled

## 2024-01-13 ENCOUNTER — Telehealth (INDEPENDENT_AMBULATORY_CARE_PROVIDER_SITE_OTHER): Payer: Self-pay

## 2024-01-16 ENCOUNTER — Ambulatory Visit (INDEPENDENT_AMBULATORY_CARE_PROVIDER_SITE_OTHER): Admitting: Otolaryngology

## 2024-01-16 ENCOUNTER — Encounter (INDEPENDENT_AMBULATORY_CARE_PROVIDER_SITE_OTHER): Payer: Self-pay | Admitting: Otolaryngology

## 2024-01-16 VITALS — BP 129/84 | HR 84

## 2024-01-16 DIAGNOSIS — Q892 Congenital malformations of other endocrine glands: Secondary | ICD-10-CM

## 2024-01-16 DIAGNOSIS — H7292 Unspecified perforation of tympanic membrane, left ear: Secondary | ICD-10-CM

## 2024-01-16 DIAGNOSIS — Z9889 Other specified postprocedural states: Secondary | ICD-10-CM

## 2024-01-16 MED ORDER — METHYLPREDNISOLONE 4 MG PO TBPK: ORAL_TABLET | ORAL | 1 refills | Status: AC

## 2024-01-16 NOTE — Progress Notes (Signed)
 ENT progress note   Here for wound check. Neck felt full to her. She had some drainage from the opening where the drain previously was. Has had some ear fullness on the left where paper patch was placed.   Exam: General: Well-developed, well-nourished Respiratory Respiratory effort: Equal inspiration and expiration without stridor Cardiovascular Peripheral Vascular: Warm extremities with equal color/perfusion Eyes: No nystagmus with equal extraocular motion bilaterally Neuro/Psych/Balance: Patient oriented to person, place, and time; Appropriate mood and affect; Gait is intact with no imbalance; Cranial nerves I-XII are intact Head and Face Inspection: Normocephalic and atraumatic without mass or lesion Palpation: Facial skeleton intact without bony stepoffs Salivary Glands: No mass or tenderness  Facial Strength: Facial motility symmetric and full bilaterally ENT Pinna: External ear intact and fully developed External canal: Canal is patent with intact skin Tympanic Membrane: left side with a paper patch in place, no drainage  External Nose: No scar or anatomic deformity Internal Nose: Septum is relatively straight on anterior rhinoscopy. Bilateral inferior turbinate hypertrophy.  Lips, Teeth, and gums: Mucosa and teeth intact and viable Oral cavity/oropharynx: No erythema or exudate, no lesions present Neck Neck and Trachea: Midline trachea without mass or lesion. Anterior neck incision clean and dry, soft no evidence of swelling, no fluctuance, drain opening still patent. No significant drainage with palpation of the neck.  Thyroid : No mass or nodularity Lymphatics: No lymphadenopathy   A/P S/p TGDC removal, doing well, she might have eustachian tube dysfunction 2/2 pressure/fullness left ear, will do a trial of Medrol  pack.   - continue current pain regimen - return as scheduled

## 2024-01-20 NOTE — Telephone Encounter (Signed)
 Patient had appt 01/16/24.

## 2024-01-23 ENCOUNTER — Telehealth (INDEPENDENT_AMBULATORY_CARE_PROVIDER_SITE_OTHER): Payer: Self-pay | Admitting: Otolaryngology

## 2024-01-23 ENCOUNTER — Encounter (INDEPENDENT_AMBULATORY_CARE_PROVIDER_SITE_OTHER): Admitting: Otolaryngology

## 2024-01-23 NOTE — Telephone Encounter (Signed)
 Confirmed appt & location [id number]

## 2024-01-24 ENCOUNTER — Ambulatory Visit (INDEPENDENT_AMBULATORY_CARE_PROVIDER_SITE_OTHER): Admitting: Otolaryngology

## 2024-01-24 VITALS — BP 128/80 | HR 101

## 2024-01-24 DIAGNOSIS — Q892 Congenital malformations of other endocrine glands: Secondary | ICD-10-CM

## 2024-01-24 DIAGNOSIS — Z9889 Other specified postprocedural states: Secondary | ICD-10-CM

## 2024-01-24 DIAGNOSIS — H7292 Unspecified perforation of tympanic membrane, left ear: Secondary | ICD-10-CM

## 2024-01-24 DIAGNOSIS — R0981 Nasal congestion: Secondary | ICD-10-CM

## 2024-01-24 DIAGNOSIS — J3089 Other allergic rhinitis: Secondary | ICD-10-CM

## 2024-01-24 DIAGNOSIS — H6993 Unspecified Eustachian tube disorder, bilateral: Secondary | ICD-10-CM

## 2024-01-24 MED ORDER — FLUTICASONE PROPIONATE 50 MCG/ACT NA SUSP: 2.0000 | Freq: Two times a day (BID) | NASAL | 6 refills | Status: AC

## 2024-01-24 MED ORDER — LEVOCETIRIZINE DIHYDROCHLORIDE 5 MG PO TABS: 5.0000 mg | ORAL_TABLET | Freq: Every evening | ORAL | 3 refills | Status: AC

## 2024-01-24 NOTE — Progress Notes (Signed)
 ENT progress note   Here for wound check. Neck felt full to her. She had some drainage from the opening where the drain previously was. Has had some ear fullness on the left where paper patch was placed.   Exam: General: Well-developed, well-nourished Respiratory Respiratory effort: Equal inspiration and expiration without stridor Cardiovascular Peripheral Vascular: Warm extremities with equal color/perfusion Eyes: No nystagmus with equal extraocular motion bilaterally Neuro/Psych/Balance: Patient oriented to person, place, and time; Appropriate mood and affect; Gait is intact with no imbalance; Cranial nerves I-XII are intact Head and Face Inspection: Normocephalic and atraumatic without mass or lesion Palpation: Facial skeleton intact without bony stepoffs Salivary Glands: No mass or tenderness  Facial Strength: Facial motility symmetric and full bilaterally ENT Pinna: External ear intact and fully developed External canal: Canal is patent with intact skin Tympanic Membrane: left side with a paper patch in place, no drainage  External Nose: No scar or anatomic deformity Internal Nose: Septum is relatively straight on anterior rhinoscopy. Bilateral inferior turbinate hypertrophy.  Lips, Teeth, and gums: Mucosa and teeth intact and viable Oral cavity/oropharynx: No erythema or exudate, no lesions present Neck Neck and Trachea: Midline trachea without mass or lesion. Anterior neck incision clean and dry, soft no evidence of swelling, no fluctuance Thyroid : No mass or nodularity Lymphatics: No lymphadenopathy   A/P Thyroglossal duct cyst Healing as expected, drain site is still healing, no neck swelling   Environmental allergies and ETD - Xyzal  and Flonase  daily to help with healing of TM perforation  - Rx set   L TM perforation  - will re-evaluate in 6 months if TM healed after paper patch - will refer for T-plasty if needed in the future

## 2024-02-20 DIAGNOSIS — R399 Unspecified symptoms and signs involving the genitourinary system: Secondary | ICD-10-CM | POA: Diagnosis not present

## 2024-02-22 ENCOUNTER — Ambulatory Visit: Admitting: Allergy

## 2024-03-26 DIAGNOSIS — R7303 Prediabetes: Secondary | ICD-10-CM | POA: Diagnosis not present

## 2024-03-26 DIAGNOSIS — K760 Fatty (change of) liver, not elsewhere classified: Secondary | ICD-10-CM | POA: Diagnosis not present

## 2024-03-26 DIAGNOSIS — Z9189 Other specified personal risk factors, not elsewhere classified: Secondary | ICD-10-CM | POA: Diagnosis not present

## 2024-03-26 DIAGNOSIS — R0683 Snoring: Secondary | ICD-10-CM | POA: Diagnosis not present

## 2024-03-26 DIAGNOSIS — Z6841 Body Mass Index (BMI) 40.0 and over, adult: Secondary | ICD-10-CM | POA: Diagnosis not present

## 2024-03-26 DIAGNOSIS — E782 Mixed hyperlipidemia: Secondary | ICD-10-CM | POA: Diagnosis not present

## 2024-03-26 DIAGNOSIS — F419 Anxiety disorder, unspecified: Secondary | ICD-10-CM | POA: Diagnosis not present

## 2024-03-26 DIAGNOSIS — E66813 Obesity, class 3: Secondary | ICD-10-CM | POA: Diagnosis not present

## 2024-04-02 ENCOUNTER — Telehealth (INDEPENDENT_AMBULATORY_CARE_PROVIDER_SITE_OTHER): Payer: Self-pay

## 2024-04-02 NOTE — Telephone Encounter (Signed)
 Left vm to r/s 07/31/24 appointment

## 2024-04-12 DIAGNOSIS — S6992XA Unspecified injury of left wrist, hand and finger(s), initial encounter: Secondary | ICD-10-CM | POA: Diagnosis not present

## 2024-04-27 DIAGNOSIS — B079 Viral wart, unspecified: Secondary | ICD-10-CM | POA: Diagnosis not present

## 2024-05-12 DIAGNOSIS — H8113 Benign paroxysmal vertigo, bilateral: Secondary | ICD-10-CM | POA: Diagnosis not present

## 2024-05-12 DIAGNOSIS — R109 Unspecified abdominal pain: Secondary | ICD-10-CM | POA: Diagnosis not present

## 2024-05-12 DIAGNOSIS — R509 Fever, unspecified: Secondary | ICD-10-CM | POA: Diagnosis not present

## 2024-05-12 DIAGNOSIS — R110 Nausea: Secondary | ICD-10-CM | POA: Diagnosis not present

## 2024-05-12 DIAGNOSIS — R42 Dizziness and giddiness: Secondary | ICD-10-CM | POA: Diagnosis not present

## 2024-05-12 DIAGNOSIS — M542 Cervicalgia: Secondary | ICD-10-CM | POA: Diagnosis not present

## 2024-07-04 ENCOUNTER — Encounter (INDEPENDENT_AMBULATORY_CARE_PROVIDER_SITE_OTHER): Payer: Self-pay

## 2024-07-04 ENCOUNTER — Telehealth (INDEPENDENT_AMBULATORY_CARE_PROVIDER_SITE_OTHER): Payer: Self-pay | Admitting: Otolaryngology

## 2024-07-04 NOTE — Telephone Encounter (Signed)
 Left voice message and sent MyChart message for patient to call back to reschedule appointment.  Dr. Soldatova no longer with our office

## 2024-07-31 ENCOUNTER — Ambulatory Visit (INDEPENDENT_AMBULATORY_CARE_PROVIDER_SITE_OTHER): Admitting: Otolaryngology
